# Patient Record
Sex: Male | Born: 1975 | Hispanic: No | Marital: Married | Smoking: Former smoker
Health system: Southern US, Community
[De-identification: ages and names within clinical notes are randomized; demographics above are authoritative.]

## PROBLEM LIST (undated history)

## (undated) DIAGNOSIS — S0990XA Unspecified injury of head, initial encounter: Secondary | ICD-10-CM

## (undated) DIAGNOSIS — K603 Anal fistula, unspecified: Secondary | ICD-10-CM

## (undated) DIAGNOSIS — G40309 Generalized idiopathic epilepsy and epileptic syndromes, not intractable, without status epilepticus: Secondary | ICD-10-CM

## (undated) DIAGNOSIS — G40209 Localization-related (focal) (partial) symptomatic epilepsy and epileptic syndromes with complex partial seizures, not intractable, without status epilepticus: Secondary | ICD-10-CM

## (undated) DIAGNOSIS — R569 Unspecified convulsions: Secondary | ICD-10-CM

## (undated) HISTORY — DX: Generalized idiopathic epilepsy and epileptic syndromes, not intractable, without status epilepticus: G40.309

## (undated) HISTORY — PX: OTHER SURGICAL HISTORY: SHX169

## (undated) HISTORY — DX: Localization-related (focal) (partial) symptomatic epilepsy and epileptic syndromes with complex partial seizures, not intractable, without status epilepticus: G40.209

---

## 1997-11-08 ENCOUNTER — Encounter: Payer: Self-pay | Admitting: Emergency Medicine

## 1997-11-08 ENCOUNTER — Emergency Department (HOSPITAL_COMMUNITY): Admission: EM | Admit: 1997-11-08 | Discharge: 1997-11-08 | Payer: Self-pay | Admitting: Emergency Medicine

## 1999-07-17 ENCOUNTER — Encounter: Payer: Self-pay | Admitting: Emergency Medicine

## 1999-07-17 ENCOUNTER — Emergency Department (HOSPITAL_COMMUNITY): Admission: EM | Admit: 1999-07-17 | Discharge: 1999-07-17 | Payer: Self-pay | Admitting: Emergency Medicine

## 2000-02-26 ENCOUNTER — Emergency Department (HOSPITAL_COMMUNITY): Admission: EM | Admit: 2000-02-26 | Discharge: 2000-02-27 | Payer: Self-pay | Admitting: Emergency Medicine

## 2000-06-11 ENCOUNTER — Encounter: Payer: Self-pay | Admitting: Emergency Medicine

## 2000-06-11 ENCOUNTER — Inpatient Hospital Stay (HOSPITAL_COMMUNITY): Admission: EM | Admit: 2000-06-11 | Discharge: 2000-06-14 | Payer: Self-pay | Admitting: Emergency Medicine

## 2000-06-12 ENCOUNTER — Encounter: Payer: Self-pay | Admitting: *Deleted

## 2003-03-20 ENCOUNTER — Emergency Department (HOSPITAL_COMMUNITY): Admission: EM | Admit: 2003-03-20 | Discharge: 2003-03-20 | Payer: Self-pay | Admitting: Emergency Medicine

## 2006-01-17 ENCOUNTER — Emergency Department (HOSPITAL_COMMUNITY): Admission: EM | Admit: 2006-01-17 | Discharge: 2006-01-17 | Payer: Self-pay | Admitting: Emergency Medicine

## 2011-01-30 ENCOUNTER — Emergency Department (HOSPITAL_COMMUNITY)
Admission: EM | Admit: 2011-01-30 | Discharge: 2011-01-30 | Disposition: A | Discharge status: Home / Self Care | Payer: Self-pay | Attending: Emergency Medicine | Admitting: Emergency Medicine

## 2011-01-30 DIAGNOSIS — F17200 Nicotine dependence, unspecified, uncomplicated: Secondary | ICD-10-CM | POA: Insufficient documentation

## 2011-01-30 DIAGNOSIS — R569 Unspecified convulsions: Secondary | ICD-10-CM | POA: Insufficient documentation

## 2011-01-30 DIAGNOSIS — L0231 Cutaneous abscess of buttock: Secondary | ICD-10-CM | POA: Insufficient documentation

## 2011-01-30 HISTORY — DX: Unspecified convulsions: R56.9

## 2011-01-30 MED ORDER — OXYCODONE-ACETAMINOPHEN 5-325 MG PO TABS: 2.0000 | ORAL_TABLET | ORAL | Status: AC | PRN

## 2011-01-30 MED ORDER — OXYCODONE-ACETAMINOPHEN 5-325 MG PO TABS
1.0000 | ORAL_TABLET | Freq: Once | ORAL | Status: AC
  Administered 2011-01-30: 1 via ORAL
  Filled 2011-01-30: qty 1

## 2011-01-30 NOTE — ED Notes (Signed)
abcess on rt. buttocks

## 2011-01-30 NOTE — ED Notes (Signed)
I & D performed by Ruby Cola, PA at approx [phone].

## 2011-01-30 NOTE — ED Provider Notes (Signed)
History     CSN: [id number] Arrival date & time: 01/30/2011  8:18 AM   First MD Initiated Contact with Patient 01/30/11 [phone]      Chief Complaint  Patient presents with  . Abscess    (Consider location/radiation/quality/duration/timing/severity/associated sxs/prior treatment) HPI History provided by pt.   Pt has had a boil on right buttock for the past week.  Has severe pain that is aggravated by sitting and having a BM.  Has tried sitting in a hot bath but abscess has not drained.  No associated fever.  No trauma to this area.   Past Medical History  Diagnosis Date  . Seizures     History reviewed. No pertinent past surgical history.  History reviewed. No pertinent family history.  History  Substance Use Topics  . Smoking status: Current Everyday Smoker  . Smokeless tobacco: Not on file  . Alcohol Use: No      Review of Systems  All other systems reviewed and are negative.    Allergies  Review of patient's allergies indicates no known allergies.  Home Medications   Current Outpatient Rx  Name Route Sig Dispense Refill  . CARBAMAZEPINE 200 MG PO TABS Oral Take 300 mg by mouth 2 (two) times daily.        BP 158/89  Pulse 96  Temp 97.9 F (36.6 C)  Resp 18  Ht 6\' 3"  (1.905 m)  Wt 225 lb (102.059 kg)  BMI 28.12 kg/m2  SpO2 98%  Physical Exam  Nursing note and vitals reviewed. Constitutional: He is oriented to person, place, and time. He appears well-developed and well-nourished. No distress.  HENT:  Head: Normocephalic and atraumatic.  Eyes:       Normal appearance  Neck: Normal range of motion.  Genitourinary: Rectum normal.  Neurological: He is alert and oriented to person, place, and time.  Skin:       1.5cm abscess w/ approx 3cm surrounding induration just right of rectum.    Psychiatric: He has a normal mood and affect. His behavior is normal.    ED Course  Procedures (including critical care time)  Labs Reviewed - No data to  display No results found.   1. Abscess of buttock, right       MDM  Pt presents w/ abscess of right buttock.  Rectum nml.  Abscess drained a large amt of pus spontaneously in ED.  Did not I&D.  D/c'd home w/ pain medication.  Advised f/u w/ PCP if pain/edema worsens or he develops difficulty w/ having a BM.          Arie Sabina [place], [place] 01/30/11 [phone]

## 2011-01-31 NOTE — ED Provider Notes (Signed)
Medical screening examination/treatment/procedure(s) were performed by non-physician practitioner and as supervising physician I was immediately available for consultation/collaboration.  Celene Kras, MD 01/31/11 [phone]

## 2012-08-07 ENCOUNTER — Telehealth: Payer: Self-pay | Admitting: *Deleted

## 2012-08-07 NOTE — Telephone Encounter (Signed)
Patient has had 3 sizures in the last three days he is on  CARBAMAZEPINE 200 MG TABS 3 times a day and states he takes it correctly. Patient is a former Dr. Thad Ranger patient who was only  seen by carolyn martin. He will see Dr. Terrace Arabia on [phone]

## 2012-08-07 NOTE — Telephone Encounter (Signed)
Message copied by Salome Spotted on Mon Aug 07, 2012  3:50 PM ------      Message from: [facility], [place]      Created: Mon Aug 07, 2012  3:20 PM      Contact: Christina       Pt has appt for 8/7 but he keeps having seizures and wants to be seen sooner.  Please call pt back and [phone] ------

## 2012-08-08 ENCOUNTER — Ambulatory Visit: Payer: Self-pay | Admitting: Neurology

## 2012-08-31 ENCOUNTER — Encounter: Payer: Self-pay | Admitting: Neurology

## 2012-08-31 DIAGNOSIS — G40309 Generalized idiopathic epilepsy and epileptic syndromes, not intractable, without status epilepticus: Secondary | ICD-10-CM

## 2012-08-31 DIAGNOSIS — G40209 Localization-related (focal) (partial) symptomatic epilepsy and epileptic syndromes with complex partial seizures, not intractable, without status epilepticus: Secondary | ICD-10-CM | POA: Insufficient documentation

## 2012-09-01 ENCOUNTER — Encounter: Payer: Self-pay | Admitting: Neurology

## 2012-09-01 ENCOUNTER — Ambulatory Visit (INDEPENDENT_AMBULATORY_CARE_PROVIDER_SITE_OTHER): Payer: BC Managed Care – PPO | Admitting: Neurology

## 2012-09-01 VITALS — BP 138/88 | HR 78 | Ht 71.5 in | Wt 218.0 lb

## 2012-09-01 DIAGNOSIS — G40209 Localization-related (focal) (partial) symptomatic epilepsy and epileptic syndromes with complex partial seizures, not intractable, without status epilepticus: Secondary | ICD-10-CM

## 2012-09-01 DIAGNOSIS — G40309 Generalized idiopathic epilepsy and epileptic syndromes, not intractable, without status epilepticus: Secondary | ICD-10-CM

## 2012-09-01 MED ORDER — LEVETIRACETAM 750 MG PO TABS: 750.0000 mg | ORAL_TABLET | Freq: Two times a day (BID) | ORAL | Status: DC
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
## 2012-09-05 ENCOUNTER — Telehealth: Payer: Self-pay

## 2012-09-05 NOTE — Telephone Encounter (Signed)
Message copied by Dan Humphreys on Tue Sep 05, 2012  4:32 PM ------      Message from: Eugenie Birks      Created: Tue Aug 08, 2012  8:36 AM       Pt wife called he could not keep appt today but wants to be called if you get something else -[phone]- christina ------

## 2012-09-05 NOTE — Telephone Encounter (Signed)
I have called left messages. No returned calls. Will be glade to schedule follow with Dr.Yan.

## 2012-09-21 ENCOUNTER — Ambulatory Visit: Payer: Self-pay | Admitting: Nurse Practitioner

## 2012-11-17 ENCOUNTER — Ambulatory Visit: Payer: Self-pay | Admitting: Neurology

## 2012-12-21 ENCOUNTER — Ambulatory Visit: Payer: BC Managed Care – PPO | Admitting: Neurology

## 2013-01-02 ENCOUNTER — Ambulatory Visit: Payer: BC Managed Care – PPO | Admitting: Neurology

## 2013-03-27 ENCOUNTER — Encounter: Payer: Self-pay | Admitting: Neurology

## 2013-03-27 ENCOUNTER — Ambulatory Visit (INDEPENDENT_AMBULATORY_CARE_PROVIDER_SITE_OTHER): Payer: BC Managed Care – PPO | Admitting: Neurology

## 2013-03-27 ENCOUNTER — Encounter (INDEPENDENT_AMBULATORY_CARE_PROVIDER_SITE_OTHER): Payer: Self-pay

## 2013-03-27 VITALS — BP 117/72 | HR 65 | Ht 71.0 in | Wt 213.0 lb

## 2013-03-27 DIAGNOSIS — G40309 Generalized idiopathic epilepsy and epileptic syndromes, not intractable, without status epilepticus: Secondary | ICD-10-CM

## 2013-03-27 DIAGNOSIS — G40209 Localization-related (focal) (partial) symptomatic epilepsy and epileptic syndromes with complex partial seizures, not intractable, without status epilepticus: Secondary | ICD-10-CM

## 2013-03-27 MED ORDER — LEVETIRACETAM 750 MG PO TABS: 750.0000 mg | ORAL_TABLET | Freq: Two times a day (BID) | ORAL | Status: DC

## 2013-03-27 MED ORDER — ZOLPIDEM TARTRATE 5 MG PO TABS: 5.0000 mg | ORAL_TABLET | Freq: Every evening | ORAL | Status: DC | PRN
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
## 2013-09-06 ENCOUNTER — Other Ambulatory Visit: Payer: Self-pay | Admitting: Neurology

## 2014-03-27 ENCOUNTER — Encounter: Payer: Self-pay | Admitting: Neurology

## 2014-03-27 ENCOUNTER — Ambulatory Visit (INDEPENDENT_AMBULATORY_CARE_PROVIDER_SITE_OTHER): Payer: BLUE CROSS/BLUE SHIELD | Admitting: Neurology

## 2014-03-27 VITALS — BP 128/82 | HR 72 | Ht 71.0 in | Wt 212.0 lb

## 2014-03-27 DIAGNOSIS — G40209 Localization-related (focal) (partial) symptomatic epilepsy and epileptic syndromes with complex partial seizures, not intractable, without status epilepticus: Secondary | ICD-10-CM

## 2014-03-27 MED ORDER — LEVETIRACETAM ER 750 MG PO TB24: 1500.0000 mg | ORAL_TABLET | Freq: Every day | ORAL | Status: DC
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
## 2014-04-08 ENCOUNTER — Ambulatory Visit (INDEPENDENT_AMBULATORY_CARE_PROVIDER_SITE_OTHER): Payer: BLUE CROSS/BLUE SHIELD | Admitting: Neurology

## 2014-04-08 DIAGNOSIS — G40209 Localization-related (focal) (partial) symptomatic epilepsy and epileptic syndromes with complex partial seizures, not intractable, without status epilepticus: Secondary | ICD-10-CM
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
## 2014-04-18 ENCOUNTER — Other Ambulatory Visit: Payer: Self-pay | Admitting: Neurology

## 2014-06-25 ENCOUNTER — Encounter: Payer: Self-pay | Admitting: Neurology

## 2014-06-25 ENCOUNTER — Ambulatory Visit (INDEPENDENT_AMBULATORY_CARE_PROVIDER_SITE_OTHER): Payer: BLUE CROSS/BLUE SHIELD | Admitting: Neurology

## 2014-06-25 VITALS — BP 135/90 | HR 58 | Ht 71.0 in | Wt 204.0 lb

## 2014-06-25 DIAGNOSIS — G40209 Localization-related (focal) (partial) symptomatic epilepsy and epileptic syndromes with complex partial seizures, not intractable, without status epilepticus: Secondary | ICD-10-CM | POA: Diagnosis not present

## 2014-06-25 MED ORDER — LEVETIRACETAM ER 750 MG PO TB24: 1500.0000 mg | ORAL_TABLET | Freq: Every day | ORAL | Status: DC
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
## 2015-03-24 ENCOUNTER — Other Ambulatory Visit: Payer: Self-pay | Admitting: Neurology

## 2015-04-22 ENCOUNTER — Telehealth: Payer: Self-pay | Admitting: Neurology

## 2015-04-22 NOTE — Telephone Encounter (Signed)
Wife called regarding Levetiracetam 750 MG TB24,wants to see if there is a cheaper medication to switch patient to. Patient no longer has insurance.

## 2015-04-23 ENCOUNTER — Encounter: Payer: Self-pay | Admitting: *Deleted

## 2015-04-23 MED ORDER — DIVALPROEX SODIUM 500 MG PO DR TAB: DELAYED_RELEASE_TABLET | ORAL | Status: DC

## 2015-04-23 NOTE — Telephone Encounter (Signed)
Pt agreeable to change and will start new medication.  Per Dr. Zannie CoveYan's instructions, for one week he will take Keppra XR 750mg , one tab in am and Depakote 1000mg  in pm.  After one week, he will stop Keppra XR and take Depakote 500mg  in am and 1000mg  in pm (verbalized understanding).  He will call back with any concerns.

## 2015-04-23 NOTE — Telephone Encounter (Signed)
Per vo by Dr. Terrace ArabiaYan, send in rx for divalproex 500mg , one tab in the morning and two tablets in the evening (#90 x 11).  [facility] [place] in order for him to use a Good Rx coupon.  His cost for 90 tablets will be $43.62.

## 2015-06-25 ENCOUNTER — Ambulatory Visit: Payer: BLUE CROSS/BLUE SHIELD | Admitting: Nurse Practitioner

## 2015-07-01 ENCOUNTER — Ambulatory Visit: Payer: BLUE CROSS/BLUE SHIELD | Admitting: Nurse Practitioner

## 2015-07-02 ENCOUNTER — Encounter: Payer: Self-pay | Admitting: Nurse Practitioner

## 2016-03-02 ENCOUNTER — Telehealth: Payer: Self-pay | Admitting: Neurology

## 2016-03-02 ENCOUNTER — Other Ambulatory Visit: Payer: Self-pay | Admitting: *Deleted

## 2016-03-02 MED ORDER — DIVALPROEX SODIUM 500 MG PO DR TAB: DELAYED_RELEASE_TABLET | ORAL | 1 refills | Status: DC

## 2016-03-02 NOTE — Telephone Encounter (Signed)
30 day supply with 1 refill sent to pharmacy 

## 2016-03-02 NOTE — Telephone Encounter (Signed)
Patient called requesting refill for divalproex (DEPAKOTE) 500 MG DR tablet.  Next fu visit 04/26/16 with Darrol Angelarolyn Martin

## 2016-03-21 ENCOUNTER — Other Ambulatory Visit: Payer: Self-pay | Admitting: Neurology

## 2016-03-27 ENCOUNTER — Other Ambulatory Visit: Payer: Self-pay | Admitting: Neurology

## 2016-03-30 ENCOUNTER — Other Ambulatory Visit: Payer: Self-pay | Admitting: Neurology

## 2016-03-30 ENCOUNTER — Other Ambulatory Visit: Payer: Self-pay | Admitting: *Deleted

## 2016-03-30 MED ORDER — DIVALPROEX SODIUM 500 MG PO DR TAB: DELAYED_RELEASE_TABLET | ORAL | 0 refills | Status: DC

## 2016-03-31 ENCOUNTER — Other Ambulatory Visit: Payer: Self-pay | Admitting: *Deleted

## 2016-03-31 ENCOUNTER — Telehealth: Payer: Self-pay | Admitting: Neurology

## 2016-03-31 MED ORDER — DIVALPROEX SODIUM 500 MG PO DR TAB: DELAYED_RELEASE_TABLET | ORAL | 0 refills | Status: DC

## 2016-03-31 NOTE — Telephone Encounter (Signed)
Returned call - no answer - left message letting him know his pharmacy has been updated and the prescription has been sent in for him.

## 2016-03-31 NOTE — Telephone Encounter (Addendum)
Pt called to check on RX for divalproex (DEPAKOTE) 500 MG DR tablet , said he was out of medication. I [facility] [facility] Says he has never used [facility]. [facility] [facility]. Please call the patient to let him know it has been sent. Thank you

## 2016-04-26 ENCOUNTER — Ambulatory Visit (INDEPENDENT_AMBULATORY_CARE_PROVIDER_SITE_OTHER): Payer: BLUE CROSS/BLUE SHIELD | Admitting: Nurse Practitioner

## 2016-04-26 ENCOUNTER — Telehealth: Payer: Self-pay | Admitting: Nurse Practitioner

## 2016-04-26 ENCOUNTER — Encounter: Payer: Self-pay | Admitting: Nurse Practitioner

## 2016-04-26 ENCOUNTER — Encounter (INDEPENDENT_AMBULATORY_CARE_PROVIDER_SITE_OTHER): Payer: Self-pay

## 2016-04-26 VITALS — BP 126/79 | HR 76 | Ht 74.0 in | Wt 230.6 lb

## 2016-04-26 DIAGNOSIS — G40209 Localization-related (focal) (partial) symptomatic epilepsy and epileptic syndromes with complex partial seizures, not intractable, without status epilepticus: Secondary | ICD-10-CM

## 2016-04-26 DIAGNOSIS — G40309 Generalized idiopathic epilepsy and epileptic syndromes, not intractable, without status epilepticus: Secondary | ICD-10-CM | POA: Diagnosis not present

## 2016-04-26 DIAGNOSIS — Z1211 Encounter for screening for malignant neoplasm of colon: Secondary | ICD-10-CM | POA: Insufficient documentation

## 2016-04-26 DIAGNOSIS — Z5181 Encounter for therapeutic drug level monitoring: Secondary | ICD-10-CM | POA: Diagnosis not present

## 2016-04-26 MED ORDER — DIVALPROEX SODIUM ER 500 MG PO TB24: ORAL_TABLET | ORAL | 6 refills | Status: DC

## 2016-04-26 NOTE — Addendum Note (Signed)
Addended by: Donnelly AngelicaHOGAN, Ryliegh Mcduffey L on: 04/26/2016 09:34 AM   Modules accepted: Orders

## 2016-04-26 NOTE — Telephone Encounter (Signed)
Patients medication for today needs to go to [facility] please. [address]
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
## 2016-04-26 NOTE — Progress Notes (Signed)
I have reviewed and agreed above plan. 

## 2016-04-26 NOTE — Patient Instructions (Signed)
Will check labs today along with Depakote level Will change Depakote to extended release formulation Call for any further seizure activity Follow-up in 6 months

## 2016-04-26 NOTE — Telephone Encounter (Signed)
Rx resent to requested pharmacy.

## 2016-04-27 ENCOUNTER — Telehealth: Payer: Self-pay | Admitting: *Deleted

## 2016-04-27 LAB — CBC WITH DIFFERENTIAL/PLATELET
Basophils Absolute: 0.1 10*3/uL (ref 0.0–0.2)
Basos: 1 %
EOS (ABSOLUTE): 0.7 10*3/uL — ABNORMAL HIGH (ref 0.0–0.4)
Eos: 6 %
Hematocrit: 45.4 % (ref 37.5–51.0)
Hemoglobin: 15.3 g/dL (ref 13.0–17.7)
Immature Grans (Abs): 0 10*3/uL (ref 0.0–0.1)
Immature Granulocytes: 0 %
Lymphocytes Absolute: 3.4 10*3/uL — ABNORMAL HIGH (ref 0.7–3.1)
Lymphs: 27 %
MCH: 32.3 pg (ref 26.6–33.0)
MCHC: 33.7 g/dL (ref 31.5–35.7)
MCV: 96 fL (ref 79–97)
Monocytes Absolute: 0.9 10*3/uL (ref 0.1–0.9)
Monocytes: 7 %
Neutrophils Absolute: 7.2 10*3/uL — ABNORMAL HIGH (ref 1.4–7.0)
Neutrophils: 59 %
Platelets: 310 10*3/uL (ref 150–379)
RBC: 4.74 x10E6/uL (ref 4.14–5.80)
RDW: 14 % (ref 12.3–15.4)
WBC: 12.2 10*3/uL — ABNORMAL HIGH (ref 3.4–10.8)

## 2016-04-27 LAB — COMPREHENSIVE METABOLIC PANEL
ALT: 14 IU/L (ref 0–44)
AST: 16 IU/L (ref 0–40)
Albumin/Globulin Ratio: 1.8 (ref 1.2–2.2)
Albumin: 4.3 g/dL (ref 3.5–5.5)
Alkaline Phosphatase: 57 IU/L (ref 39–117)
BUN/Creatinine Ratio: 13 (ref 9–20)
BUN: 12 mg/dL (ref 6–24)
Bilirubin Total: <0.2 mg/dL (ref 0.0–1.2)
CO2: 21 mmol/L (ref 18–29)
Calcium: 9.4 mg/dL (ref 8.7–10.2)
Chloride: 104 mmol/L (ref 96–106)
Creatinine, Ser: 0.92 mg/dL (ref 0.76–1.27)
GFR calc Af Amer: 118 mL/min/{1.73_m2} (ref >59)
GFR calc non Af Amer: 102 mL/min/{1.73_m2} (ref >59)
Globulin, Total: 2.4 g/dL (ref 1.5–4.5)
Glucose: 69 mg/dL (ref 65–99)
Potassium: 4.7 mmol/L (ref 3.5–5.2)
Sodium: 145 mmol/L — ABNORMAL HIGH (ref 134–144)
Total Protein: 6.7 g/dL (ref 6.0–8.5)

## 2016-04-27 LAB — VALPROIC ACID LEVEL: Valproic Acid Lvl: 69 ug/mL (ref 50–100)

## 2016-04-27 NOTE — Telephone Encounter (Signed)
Called and LVM letting pt know labs ok per CW,NP note. Gave [name] phone number if he has further questions or concerns.

## 2016-04-27 NOTE — Telephone Encounter (Signed)
-----   Message from Nilda RiggsNancy Carolyn Martin, NP sent at 04/27/2016  8:48 AM EDT ----- Labs ok please call

## 2016-11-01 ENCOUNTER — Ambulatory Visit: Payer: BLUE CROSS/BLUE SHIELD | Admitting: Nurse Practitioner

## 2016-11-21 ENCOUNTER — Other Ambulatory Visit: Payer: Self-pay | Admitting: Nurse Practitioner

## 2016-12-22 ENCOUNTER — Other Ambulatory Visit: Payer: Self-pay | Admitting: Nurse Practitioner

## 2016-12-27 ENCOUNTER — Telehealth: Payer: Self-pay | Admitting: Neurology

## 2016-12-27 NOTE — Telephone Encounter (Addendum)
error 

## 2017-04-13 ENCOUNTER — Encounter: Payer: Self-pay | Admitting: Neurology

## 2017-04-13 ENCOUNTER — Encounter (INDEPENDENT_AMBULATORY_CARE_PROVIDER_SITE_OTHER): Payer: Self-pay

## 2017-04-13 ENCOUNTER — Ambulatory Visit (INDEPENDENT_AMBULATORY_CARE_PROVIDER_SITE_OTHER): Payer: BLUE CROSS/BLUE SHIELD | Admitting: Neurology

## 2017-04-13 VITALS — BP 139/93 | HR 73 | Ht 74.0 in | Wt 224.0 lb

## 2017-04-13 DIAGNOSIS — G40209 Localization-related (focal) (partial) symptomatic epilepsy and epileptic syndromes with complex partial seizures, not intractable, without status epilepticus: Secondary | ICD-10-CM

## 2017-04-13 MED ORDER — DIVALPROEX SODIUM ER 500 MG PO TB24: 1500.0000 mg | ORAL_TABLET | Freq: Every day | ORAL | 6 refills | Status: DC
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
## 2018-04-17 ENCOUNTER — Encounter: Payer: Self-pay | Admitting: Neurology

## 2018-04-17 ENCOUNTER — Ambulatory Visit: Payer: BLUE CROSS/BLUE SHIELD | Admitting: Neurology

## 2018-04-17 VITALS — BP 138/87 | HR 62 | Ht 74.0 in | Wt 225.0 lb

## 2018-04-17 DIAGNOSIS — G40309 Generalized idiopathic epilepsy and epileptic syndromes, not intractable, without status epilepticus: Secondary | ICD-10-CM

## 2018-04-17 MED ORDER — DIVALPROEX SODIUM ER 500 MG PO TB24: 1500.0000 mg | ORAL_TABLET | Freq: Every day | ORAL | 4 refills | Status: DC
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
## 2018-04-18 ENCOUNTER — Telehealth: Payer: Self-pay | Admitting: Neurology

## 2018-04-18 LAB — CBC WITH DIFFERENTIAL
Basophils Absolute: 0.1 10*3/uL (ref 0.0–0.2)
Basos: 0 %
EOS (ABSOLUTE): 0.6 10*3/uL — ABNORMAL HIGH (ref 0.0–0.4)
Eos: 5 %
Hematocrit: 42.2 % (ref 37.5–51.0)
Hemoglobin: 14.6 g/dL (ref 13.0–17.7)
Immature Grans (Abs): 0 10*3/uL (ref 0.0–0.1)
Immature Granulocytes: 0 %
Lymphocytes Absolute: 2 10*3/uL (ref 0.7–3.1)
Lymphs: 17 %
MCH: 33.2 pg — ABNORMAL HIGH (ref 26.6–33.0)
MCHC: 34.6 g/dL (ref 31.5–35.7)
MCV: 96 fL (ref 79–97)
Monocytes Absolute: 0.6 10*3/uL (ref 0.1–0.9)
Monocytes: 6 %
Neutrophils Absolute: 8.2 10*3/uL — ABNORMAL HIGH (ref 1.4–7.0)
Neutrophils: 72 %
RBC: 4.4 x10E6/uL (ref 4.14–5.80)
RDW: 12.7 % (ref 11.6–15.4)
WBC: 11.5 10*3/uL — ABNORMAL HIGH (ref 3.4–10.8)

## 2018-04-18 LAB — COMPREHENSIVE METABOLIC PANEL
ALT: 12 IU/L (ref 0–44)
AST: 13 IU/L (ref 0–40)
Albumin/Globulin Ratio: 1.9 (ref 1.2–2.2)
Albumin: 4.4 g/dL (ref 4.0–5.0)
Alkaline Phosphatase: 63 IU/L (ref 39–117)
BUN/Creatinine Ratio: 8 — ABNORMAL LOW (ref 9–20)
BUN: 8 mg/dL (ref 6–24)
Bilirubin Total: <0.2 mg/dL (ref 0.0–1.2)
CO2: 24 mmol/L (ref 20–29)
Calcium: 9.9 mg/dL (ref 8.7–10.2)
Chloride: 103 mmol/L (ref 96–106)
Creatinine, Ser: 1 mg/dL (ref 0.76–1.27)
GFR calc Af Amer: 105 mL/min/{1.73_m2} (ref >59)
GFR calc non Af Amer: 91 mL/min/{1.73_m2} (ref >59)
Globulin, Total: 2.3 g/dL (ref 1.5–4.5)
Glucose: 93 mg/dL (ref 65–99)
Potassium: 4.8 mmol/L (ref 3.5–5.2)
Sodium: 142 mmol/L (ref 134–144)
Total Protein: 6.7 g/dL (ref 6.0–8.5)

## 2018-04-18 LAB — LIPID PANEL
Chol/HDL Ratio: 4.6 ratio (ref 0.0–5.0)
Cholesterol, Total: 166 mg/dL (ref 100–199)
HDL: 36 mg/dL — ABNORMAL LOW (ref >39)
LDL Calculated: 108 mg/dL — ABNORMAL HIGH (ref 0–99)
Triglycerides: 108 mg/dL (ref 0–149)
VLDL Cholesterol Cal: 22 mg/dL (ref 5–40)

## 2018-04-18 LAB — VALPROIC ACID LEVEL: Valproic Acid Lvl: 44 ug/mL — ABNORMAL LOW (ref 50–100)

## 2018-04-18 NOTE — Telephone Encounter (Signed)
Please call patient, laboratory evaluation showed normal CMP, CBC showed mild elevated WBC, is at his baseline, fasting lipid profile showed mildly elevated LDL 108, he should start with exercise, diet control, Depakote level was 44,  He should continue current Depakote ER 500 mg 3 tablets every night

## 2018-04-18 NOTE — Telephone Encounter (Signed)
Spoke to patient and he is aware of his lab results.  He understands Dr. Zannie Cove recommendations for exercise and diet control.  He will also continue his current Depakote ER 500mg , 3 tablets every night.

## 2018-04-27 ENCOUNTER — Other Ambulatory Visit: Payer: Self-pay | Admitting: Neurology

## 2018-04-30 ENCOUNTER — Other Ambulatory Visit: Payer: Self-pay | Admitting: Neurology

## 2018-05-01 ENCOUNTER — Other Ambulatory Visit: Payer: Self-pay | Admitting: *Deleted

## 2018-05-01 MED ORDER — DIVALPROEX SODIUM ER 500 MG PO TB24: 1500.0000 mg | ORAL_TABLET | Freq: Every day | ORAL | 3 refills | Status: DC

## 2019-01-29 ENCOUNTER — Telehealth: Payer: Self-pay | Admitting: Neurology

## 2019-01-29 MED ORDER — DIVALPROEX SODIUM ER 500 MG PO TB24: 1500.0000 mg | ORAL_TABLET | Freq: Every day | ORAL | 1 refills | Status: DC

## 2019-01-29 NOTE — Telephone Encounter (Signed)
The patient called back and requested [facility] on [address] in [place].  He did not mean to tell us [facility].  Rx canceled at [facility].  Pharmacy updated and prescription resent.

## 2019-01-29 NOTE — Telephone Encounter (Signed)
Pt called stating he is needing refill on his divalproex (DEPAKOTE ER) 500 MG 24 hr tablet sent to the [facility] on [address]. [phone]

## 2019-01-29 NOTE — Telephone Encounter (Signed)
Refills sent to requested pharmacy and updated in his chart.

## 2019-04-18 ENCOUNTER — Encounter: Payer: Self-pay | Admitting: Neurology

## 2019-04-18 ENCOUNTER — Ambulatory Visit: Payer: BLUE CROSS/BLUE SHIELD | Admitting: Neurology
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
## 2019-05-12 ENCOUNTER — Other Ambulatory Visit: Payer: Self-pay | Admitting: Neurology

## 2019-05-24 ENCOUNTER — Telehealth: Payer: Self-pay | Admitting: Neurology

## 2019-05-24 NOTE — Telephone Encounter (Signed)
The prescription was sent on 05/13/19. The patient was told by [facility] that it was not there. I called and spoke to Candace who confirmed the rx was placed on his profile. They will get is ready for pick up. The patient is aware of this update.

## 2019-05-24 NOTE — Telephone Encounter (Signed)
Pt has called for a refill on his divalproex (DEPAKOTE ER) 500 MG 24 hr tablet [id number]

## 2019-06-19 ENCOUNTER — Encounter: Payer: Self-pay | Admitting: Neurology

## 2019-06-19 ENCOUNTER — Ambulatory Visit (INDEPENDENT_AMBULATORY_CARE_PROVIDER_SITE_OTHER): Payer: 59 | Admitting: Neurology

## 2019-06-19 ENCOUNTER — Other Ambulatory Visit: Payer: Self-pay

## 2019-06-19 VITALS — BP 132/89 | HR 74 | Temp 97.4°F | Ht 74.0 in | Wt 218.0 lb

## 2019-06-19 DIAGNOSIS — G40209 Localization-related (focal) (partial) symptomatic epilepsy and epileptic syndromes with complex partial seizures, not intractable, without status epilepticus: Secondary | ICD-10-CM | POA: Diagnosis not present

## 2019-06-19 MED ORDER — DIVALPROEX SODIUM ER 500 MG PO TB24: 1500.0000 mg | ORAL_TABLET | Freq: Every day | ORAL | 4 refills | Status: DC
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
## 2019-06-20 ENCOUNTER — Telehealth: Payer: Self-pay | Admitting: *Deleted

## 2019-06-20 LAB — CBC WITH DIFFERENTIAL
Basophils Absolute: 0.1 10*3/uL (ref 0.0–0.2)
Basos: 1 %
EOS (ABSOLUTE): 0.5 10*3/uL — ABNORMAL HIGH (ref 0.0–0.4)
Eos: 5 %
Hematocrit: 46.1 % (ref 37.5–51.0)
Hemoglobin: 15.2 g/dL (ref 13.0–17.7)
Immature Grans (Abs): 0 10*3/uL (ref 0.0–0.1)
Immature Granulocytes: 0 %
Lymphocytes Absolute: 2.4 10*3/uL (ref 0.7–3.1)
Lymphs: 27 %
MCH: 32.1 pg (ref 26.6–33.0)
MCHC: 33 g/dL (ref 31.5–35.7)
MCV: 97 fL (ref 79–97)
Monocytes Absolute: 0.7 10*3/uL (ref 0.1–0.9)
Monocytes: 8 %
Neutrophils Absolute: 5.2 10*3/uL (ref 1.4–7.0)
Neutrophils: 59 %
RBC: 4.74 x10E6/uL (ref 4.14–5.80)
RDW: 13.1 % (ref 11.6–15.4)
WBC: 8.8 10*3/uL (ref 3.4–10.8)

## 2019-06-20 LAB — COMPREHENSIVE METABOLIC PANEL
ALT: 17 IU/L (ref 0–44)
AST: 13 IU/L (ref 0–40)
Albumin/Globulin Ratio: 1.9 (ref 1.2–2.2)
Albumin: 4.3 g/dL (ref 4.0–5.0)
Alkaline Phosphatase: 72 IU/L (ref 39–117)
BUN/Creatinine Ratio: 12 (ref 9–20)
BUN: 11 mg/dL (ref 6–24)
Bilirubin Total: <0.2 mg/dL (ref 0.0–1.2)
CO2: 24 mmol/L (ref 20–29)
Calcium: 9.8 mg/dL (ref 8.7–10.2)
Chloride: 106 mmol/L (ref 96–106)
Creatinine, Ser: 0.95 mg/dL (ref 0.76–1.27)
GFR calc Af Amer: 111 mL/min/{1.73_m2} (ref >59)
GFR calc non Af Amer: 96 mL/min/{1.73_m2} (ref >59)
Globulin, Total: 2.3 g/dL (ref 1.5–4.5)
Glucose: 96 mg/dL (ref 65–99)
Potassium: 4.6 mmol/L (ref 3.5–5.2)
Sodium: 144 mmol/L (ref 134–144)
Total Protein: 6.6 g/dL (ref 6.0–8.5)

## 2019-06-20 LAB — TSH: TSH: 1.82 u[IU]/mL (ref 0.450–4.500)

## 2019-06-20 NOTE — Telephone Encounter (Signed)
I spoke to the patient and he is aware of his lab results. 

## 2019-06-20 NOTE — Telephone Encounter (Signed)
-----   Message from Levert Feinstein, MD sent at 06/20/2019 10:34 AM EDT ----- Please call patient for normal laboratory result

## 2019-07-09 ENCOUNTER — Ambulatory Visit: Payer: Self-pay | Admitting: General Surgery

## 2019-07-09 NOTE — H&P (Signed)
  The patient is a 44 year old male who presents with a complaint of Mass. 44 year old male who presents to the office with a mass noticed on his buttock. Over the past few months. He reports that it opens up and drained purulence and then closes. Then this reoccurs. He has never had anything like this before.   Diagnostic Studies History Renee Ramus, CMA; 07/09/2019 1:26 PM) Colonoscopy 5-10 years ago  Allergies Renee Ramus, CMA; 07/09/2019 1:27 PM) No Known Drug Allergies [07/09/2019]:  Medication History (Armen Ferguson, CMA; 07/09/2019 1:27 PM) Divalproex Sodium ER (500MG  Tablet ER 24HR, Oral) Active. Medications Reconciled  Social History , CMA; 07/09/2019 1:26 PM) Alcohol use Occasional alcohol use. Caffeine use Tea. No drug use Tobacco use Former smoker.  Family History 07/11/2019, CMA; 07/09/2019 1:26 PM) Breast Cancer Mother. Diabetes Mellitus Father, Mother. Prostate Cancer Father.  Other Problems 07/11/2019, CMA; 07/09/2019 1:26 PM) Seizure Disorder     Review of Systems (Armen Ferguson CMA; 07/09/2019 1:26 PM) General Not Present- Appetite Loss, Chills, Fatigue, Fever, Night Sweats, Weight Gain and Weight Loss. Breast Not Present- Breast Mass, Breast Pain, Nipple Discharge and Skin Changes. Gastrointestinal Not Present- Abdominal Pain, Bloating, Bloody Stool, Change in Bowel Habits, Chronic diarrhea, Constipation, Difficulty Swallowing, Excessive gas, Gets full quickly at meals, Hemorrhoids, Indigestion, Nausea, Rectal Pain and Vomiting. Male Genitourinary Not Present- Blood in Urine, Change in Urinary Stream, Frequency, Impotence, Nocturia, Painful Urination, Urgency and Urine Leakage. Psychiatric Not Present- Anxiety, Bipolar, Change in Sleep Pattern, Depression, Fearful and Frequent crying. Endocrine Not Present- Cold Intolerance, Excessive Hunger, Hair Changes, Heat Intolerance, Hot flashes and New Diabetes. Hematology Not  Present- Blood Thinners, Easy Bruising, Excessive bleeding, Gland problems, HIV and Persistent Infections.  Vitals (Armen Ferguson CMA; 07/09/2019 1:27 PM) 07/09/2019 1:27 PM Weight: 213.5 lb Height: 73in Body Surface Area: 2.21 m Body Mass Index: 28.17 kg/m  Temp.: 98.80F  Pulse: 91 (Regular)  P.OX: 95% (Room air) BP: 132/90(Sitting, Left Arm, Standard)        Physical Exam 07/11/2019 MD; 07/09/2019 1:34 PM)  General Mental Status-Alert. General Appearance-Cooperative.  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 I recommended an exam under anesthesia with possible fistulotomy versus seton placement. We discussed fistulotomy as the best way to get rid of these, but this can result in incontinence of more than 20% of the anal sphincter was divided. If this is the case, we will proceed with seton placement and allow this to remain for approximately 3 months. We would then proceed with a ligation of internal fistula tract, which has approximately 80% success rate. We discussed the typical postoperative healing times above procedures. Risk of pain and bleeding were also discussed. All questions were answered.

## 2019-08-22 ENCOUNTER — Other Ambulatory Visit: Payer: Self-pay

## 2019-08-22 ENCOUNTER — Encounter (HOSPITAL_BASED_OUTPATIENT_CLINIC_OR_DEPARTMENT_OTHER): Payer: Self-pay | Admitting: General Surgery

## 2019-08-22 NOTE — Progress Notes (Addendum)
Spoke w/ via phone for pre-op interview---pt Lab needs dos----    I stat 8           COVID test ------08-27-2019 Arrive at -------630 am 08-29-19 No food after midnight clear liquids from midnight until 530 am Medications to take morning of surgery -----none Diabetic medication -----n/a Patient Special Instructions -----none Pre-Op special Istructions -----none Patient verbalized understanding of instructions that were given at this phone interview. Patient denies shortness of breath, chest pain, fever, cough a this phone interview.  Anesthesia : seizures last seizure 3 weeks ago, on medication for seizures patient denies cardiac S & S and sob at this time  Chart to [place] zanetto pa for review  PCP:dr robert scott Neurology dr Merrily Brittle 06-19-2019 epic Cardiologist :none Chest x-ray :none EKG :none Echo :none Cardiac Cath : none Activity level: can climb flight of steps without problems, works full time does house and yard work Sleep Study/ CPAP :none Fasting Blood Sugar :      / Checks Blood Sugar -- times a day:  none Blood Thinner/ Instructions /Last Dose:none ASA / Instructions/ Last Dose : none

## 2019-08-22 NOTE — Progress Notes (Signed)
NEW Covid Policy July 2021  Surgery Day:  08-29-19 Facility:  [facility]  Type of Surgery:anal exan under anesthesia fistulotomy vs seton  Fully Covid Vaccinated:  no Not Covid Vaccinated:  covid test 08-27-19 1000 am  Do you have symptoms?no  In the past 14 days:        Have you had any symptoms?no       Have you been tested covid positive?no       Have you been in contact with someone covid positive?no        Is pt Immuno-compromised?no

## 2019-08-25 ENCOUNTER — Other Ambulatory Visit (HOSPITAL_COMMUNITY): Payer: Self-pay

## 2019-08-27 ENCOUNTER — Other Ambulatory Visit (HOSPITAL_COMMUNITY)
Admission: RE | Admit: 2019-08-27 | Discharge: 2019-08-27 | Disposition: A | Discharge status: Home / Self Care | Payer: 59 | Source: Ambulatory Visit | Attending: General Surgery | Admitting: General Surgery

## 2019-08-27 DIAGNOSIS — Z20822 Contact with and (suspected) exposure to covid-19: Secondary | ICD-10-CM | POA: Diagnosis present

## 2019-08-27 LAB — SARS CORONAVIRUS 2 (TAT 6-24 HRS): SARS Coronavirus 2: NEGATIVE

## 2019-08-27 NOTE — Progress Notes (Signed)
Left pt message to call back to [phone] about last seizure and did he let neurology know/

## 2019-08-28 ENCOUNTER — Encounter (HOSPITAL_BASED_OUTPATIENT_CLINIC_OR_DEPARTMENT_OTHER): Payer: Self-pay | Admitting: General Surgery

## 2019-08-28 NOTE — Anesthesia Preprocedure Evaluation (Addendum)
Anesthesia Evaluation  Patient identified by MRN, date of birth, ID band Patient awake    Reviewed: Allergy & Precautions, H&P , NPO status , Patient's Chart, lab work & pertinent test results  Airway Mallampati: I  TM Distance: >3 FB Neck ROM: Full    Dental no notable dental hx. (+) Teeth Intact, Dental Advisory Given   Pulmonary former smoker,    Pulmonary exam normal breath sounds clear to auscultation       Cardiovascular Exercise Tolerance: Good Normal cardiovascular exam Rhythm:Regular Rate:Normal     Neuro/Psych Seizures -, Well Controlled,     GI/Hepatic   Endo/Other    Renal/GU      Musculoskeletal   Abdominal   Peds  Hematology   Anesthesia Other Findings   Reproductive/Obstetrics                            Anesthesia Physical Anesthesia Plan  ASA: II  Anesthesia Plan: MAC   Post-op Pain Management:    Induction:   PONV Risk Score and Plan:   Airway Management Planned: Nasal Cannula, Simple Face Mask and Mask  Additional Equipment:   Intra-op Plan:   Post-operative Plan:   Informed Consent: I have reviewed the patients History and Physical, chart, labs and discussed the procedure including the risks, benefits and alternatives for the proposed anesthesia with the patient or authorized representative who has indicated his/her understanding and acceptance.       Plan Discussed with: Anesthesiologist and CRNA  Anesthesia Plan Comments:         Anesthesia Quick Evaluation

## 2019-08-28 NOTE — Progress Notes (Addendum)
Spoke with pt by phone, pt stated last seizure since 2018 was 3 weeks aho witnessed by girlfriend in his sleep, it lasted about 5 second, he made weird moaning noise and rolled over hard, pt did not notify neurololgy, pt stated he just usually tells neurololgy at his appointments.Jessica zanetto pa made aware of above note. Spoke with Shanda Bumps zanetto pa pt meets [facility] guidelines

## 2019-08-28 NOTE — Progress Notes (Signed)
Left pt message to call back to [phone] about last seizure and did he let seizure know?

## 2019-08-29 ENCOUNTER — Ambulatory Visit (HOSPITAL_BASED_OUTPATIENT_CLINIC_OR_DEPARTMENT_OTHER)
Admission: RE | Admit: 2019-08-29 | Discharge: 2019-08-29 | Disposition: A | Discharge status: Home / Self Care | Payer: 59 | Attending: General Surgery | Admitting: General Surgery

## 2019-08-29 ENCOUNTER — Encounter (HOSPITAL_BASED_OUTPATIENT_CLINIC_OR_DEPARTMENT_OTHER): Payer: Self-pay | Admitting: General Surgery

## 2019-08-29 ENCOUNTER — Ambulatory Visit (HOSPITAL_BASED_OUTPATIENT_CLINIC_OR_DEPARTMENT_OTHER): Payer: 59 | Admitting: Physician Assistant

## 2019-08-29 ENCOUNTER — Encounter (HOSPITAL_BASED_OUTPATIENT_CLINIC_OR_DEPARTMENT_OTHER)
Admission: RE | Disposition: A | Discharge status: Home / Self Care | Payer: Self-pay | Source: Home / Self Care | Attending: General Surgery

## 2019-08-29 DIAGNOSIS — G40909 Epilepsy, unspecified, not intractable, without status epilepticus: Secondary | ICD-10-CM | POA: Insufficient documentation

## 2019-08-29 DIAGNOSIS — Z87891 Personal history of nicotine dependence: Secondary | ICD-10-CM | POA: Insufficient documentation

## 2019-08-29 DIAGNOSIS — K603 Anal fistula: Secondary | ICD-10-CM | POA: Insufficient documentation

## 2019-08-29 HISTORY — PX: RECTAL EXAM UNDER ANESTHESIA: SHX6399

## 2019-08-29 HISTORY — PX: FISTULOTOMY: SHX6413

## 2019-08-29 HISTORY — DX: Unspecified injury of head, initial encounter: S09.90XA

## 2019-08-29 LAB — POCT I-STAT, CHEM 8
BUN: 9 mg/dL (ref 6–20)
Calcium, Ion: 1.27 mmol/L (ref 1.15–1.40)
Chloride: 103 mmol/L (ref 98–111)
Creatinine, Ser: 1.3 mg/dL — ABNORMAL HIGH (ref 0.61–1.24)
Glucose, Bld: 100 mg/dL — ABNORMAL HIGH (ref 70–99)
HCT: 44 % (ref 39.0–52.0)
Hemoglobin: 15 g/dL (ref 13.0–17.0)
Potassium: 3.7 mmol/L (ref 3.5–5.1)
Sodium: 143 mmol/L (ref 135–145)
TCO2: 28 mmol/L (ref 22–32)

## 2019-08-29 SURGERY — EXAM UNDER ANESTHESIA, RECTUM
Anesthesia: Monitor Anesthesia Care | Site: Rectum

## 2019-08-29 MED ORDER — LIDOCAINE 2% (20 MG/ML) 5 ML SYRINGE
INTRAMUSCULAR | Status: AC
  Filled 2019-08-29: qty 5

## 2019-08-29 MED ORDER — KETOROLAC TROMETHAMINE 30 MG/ML IJ SOLN
INTRAMUSCULAR | Status: AC
  Filled 2019-08-29: qty 1

## 2019-08-29 MED ORDER — PROPOFOL 10 MG/ML IV BOLUS
INTRAVENOUS | Status: AC
  Filled 2019-08-29: qty 20

## 2019-08-29 MED ORDER — SODIUM CHLORIDE 0.9 % IV SOLN: 250.0000 mL | INTRAVENOUS | Status: DC | PRN

## 2019-08-29 MED ORDER — ACETAMINOPHEN 500 MG PO TABS
ORAL_TABLET | ORAL | Status: AC
  Filled 2019-08-29: qty 2

## 2019-08-29 MED ORDER — ONDANSETRON HCL 4 MG/2ML IJ SOLN: 4.0000 mg | Freq: Once | INTRAMUSCULAR | Status: DC | PRN

## 2019-08-29 MED ORDER — FENTANYL CITRATE (PF) 100 MCG/2ML IJ SOLN
INTRAMUSCULAR | Status: AC
  Filled 2019-08-29: qty 2

## 2019-08-29 MED ORDER — ONDANSETRON HCL 4 MG/2ML IJ SOLN
INTRAMUSCULAR | Status: AC
  Filled 2019-08-29: qty 2

## 2019-08-29 MED ORDER — ACETAMINOPHEN 325 MG RE SUPP: 650.0000 mg | RECTAL | Status: DC | PRN

## 2019-08-29 MED ORDER — PROPOFOL 500 MG/50ML IV EMUL
INTRAVENOUS | Status: AC
  Filled 2019-08-29: qty 50

## 2019-08-29 MED ORDER — LIDOCAINE 2% (20 MG/ML) 5 ML SYRINGE
INTRAMUSCULAR | Status: DC | PRN
  Administered 2019-08-29: 40 mg via INTRAVENOUS

## 2019-08-29 MED ORDER — BUPIVACAINE-EPINEPHRINE 0.5% -1:200000 IJ SOLN
INTRAMUSCULAR | Status: DC | PRN
  Administered 2019-08-29: 50 mL

## 2019-08-29 MED ORDER — DEXMEDETOMIDINE (PRECEDEX) IN NS 20 MCG/5ML (4 MCG/ML) IV SYRINGE
PREFILLED_SYRINGE | INTRAVENOUS | Status: DC | PRN
  Administered 2019-08-29 (×3): 4 ug via INTRAVENOUS

## 2019-08-29 MED ORDER — ACETAMINOPHEN 325 MG PO TABS: 325.0000 mg | ORAL_TABLET | ORAL | Status: DC | PRN

## 2019-08-29 MED ORDER — GLYCOPYRROLATE PF 0.2 MG/ML IJ SOSY
PREFILLED_SYRINGE | INTRAMUSCULAR | Status: DC | PRN
Start: 2019-08-29 — End: 2019-08-29
  Administered 2019-08-29: .2 mg via INTRAVENOUS

## 2019-08-29 MED ORDER — LACTATED RINGERS IV SOLN: INTRAVENOUS | Status: DC

## 2019-08-29 MED ORDER — BUPIVACAINE LIPOSOME 1.3 % IJ SUSP
INTRAMUSCULAR | Status: AC
  Filled 2019-08-29: qty 20

## 2019-08-29 MED ORDER — GLYCOPYRROLATE PF 0.2 MG/ML IJ SOSY
PREFILLED_SYRINGE | INTRAMUSCULAR | Status: AC
  Filled 2019-08-29: qty 1

## 2019-08-29 MED ORDER — ACETAMINOPHEN 160 MG/5ML PO SOLN: 325.0000 mg | ORAL | Status: DC | PRN

## 2019-08-29 MED ORDER — SODIUM CHLORIDE 0.9% FLUSH: 3.0000 mL | Freq: Two times a day (BID) | INTRAVENOUS | Status: DC

## 2019-08-29 MED ORDER — OXYCODONE HCL 5 MG PO TABS: 5.0000 mg | ORAL_TABLET | ORAL | Status: DC | PRN

## 2019-08-29 MED ORDER — OXYCODONE HCL 5 MG PO TABS: 5.0000 mg | ORAL_TABLET | Freq: Once | ORAL | Status: DC | PRN

## 2019-08-29 MED ORDER — PROPOFOL 10 MG/ML IV BOLUS
INTRAVENOUS | Status: DC | PRN
  Administered 2019-08-29: 40 mg via INTRAVENOUS

## 2019-08-29 MED ORDER — ACETAMINOPHEN 325 MG PO TABS: 650.0000 mg | ORAL_TABLET | ORAL | Status: DC | PRN

## 2019-08-29 MED ORDER — MIDAZOLAM HCL 2 MG/2ML IJ SOLN
INTRAMUSCULAR | Status: DC | PRN
  Administered 2019-08-29: 2 mg via INTRAVENOUS

## 2019-08-29 MED ORDER — BUPIVACAINE-EPINEPHRINE 0.5% -1:200000 IJ SOLN
INTRAMUSCULAR | Status: AC
  Filled 2019-08-29: qty 1

## 2019-08-29 MED ORDER — SODIUM CHLORIDE 0.9% FLUSH: 3.0000 mL | INTRAVENOUS | Status: DC | PRN

## 2019-08-29 MED ORDER — TRAMADOL HCL 50 MG PO TABS: 50.0000 mg | ORAL_TABLET | Freq: Four times a day (QID) | ORAL | 1 refills | Status: DC | PRN

## 2019-08-29 MED ORDER — PROPOFOL 500 MG/50ML IV EMUL
INTRAVENOUS | Status: DC | PRN
  Administered 2019-08-29: 200 ug/kg/min via INTRAVENOUS

## 2019-08-29 MED ORDER — MIDAZOLAM HCL 2 MG/2ML IJ SOLN
INTRAMUSCULAR | Status: AC
  Filled 2019-08-29: qty 2

## 2019-08-29 MED ORDER — LIDOCAINE 5 % EX OINT
TOPICAL_OINTMENT | CUTANEOUS | Status: AC
  Filled 2019-08-29: qty 35.44

## 2019-08-29 MED ORDER — ACETAMINOPHEN 500 MG PO TABS
1000.0000 mg | ORAL_TABLET | ORAL | Status: AC
  Administered 2019-08-29: 1000 mg via ORAL

## 2019-08-29 MED ORDER — OXYCODONE HCL 5 MG/5ML PO SOLN: 5.0000 mg | Freq: Once | ORAL | Status: DC | PRN

## 2019-08-29 MED ORDER — MEPERIDINE HCL 25 MG/ML IJ SOLN
6.2500 mg | INTRAMUSCULAR | Status: DC | PRN
Start: 2019-08-29 — End: 2019-08-29

## 2019-08-29 MED ORDER — DEXMEDETOMIDINE HCL IN NACL 200 MCG/50ML IV SOLN
INTRAVENOUS | Status: AC
  Filled 2019-08-29: qty 50

## 2019-08-29 MED ORDER — FENTANYL CITRATE (PF) 100 MCG/2ML IJ SOLN: 25.0000 ug | INTRAMUSCULAR | Status: DC | PRN

## 2019-08-29 SURGICAL SUPPLY — 50 items
APL SKNCLS STERI-STRIP NONHPOA (GAUZE/BANDAGES/DRESSINGS) ×1
BENZOIN TINCTURE PRP APPL 2/3 (GAUZE/BANDAGES/DRESSINGS) ×3 IMPLANT
BLADE EXTENDED COATED 6.5IN (ELECTRODE) IMPLANT
BLADE HEX COATED 2.75 (ELECTRODE) ×2 IMPLANT
BLADE SURG 10 STRL SS (BLADE) IMPLANT
BRIEF STRETCH FOR OB PAD LRG (UNDERPADS AND DIAPERS) ×2 IMPLANT
CANISTER SUCT 3000ML PPV (MISCELLANEOUS) ×2 IMPLANT
COVER BACK TABLE 60X90IN (DRAPES) ×2 IMPLANT
COVER MAYO STAND STRL (DRAPES) ×2 IMPLANT
COVER WAND RF STERILE (DRAPES) ×2 IMPLANT
DECANTER SPIKE VIAL GLASS SM (MISCELLANEOUS) ×2 IMPLANT
DRAPE LAPAROTOMY 100X72 PEDS (DRAPES) ×2 IMPLANT
DRAPE UTILITY XL STRL (DRAPES) ×2 IMPLANT
DRSG PAD ABDOMINAL 8X10 ST (GAUZE/BANDAGES/DRESSINGS) ×2 IMPLANT
ELECT REM PT RETURN 9FT ADLT (ELECTROSURGICAL) ×2
ELECTRODE REM PT RTRN 9FT ADLT (ELECTROSURGICAL) ×1 IMPLANT
GAUZE SPONGE 4X4 12PLY STRL (GAUZE/BANDAGES/DRESSINGS) ×2 IMPLANT
GAUZE SPONGE 4X4 12PLY STRL LF (GAUZE/BANDAGES/DRESSINGS) ×1 IMPLANT
GLOVE BIO SURGEON STRL SZ 6.5 (GLOVE) ×2 IMPLANT
GLOVE BIOGEL PI IND STRL 7.0 (GLOVE) ×1 IMPLANT
GLOVE BIOGEL PI INDICATOR 7.0 (GLOVE) ×1
GOWN STRL REUS W/TWL XL LVL3 (GOWN DISPOSABLE) ×2 IMPLANT
HYDROGEN PEROXIDE 16OZ (MISCELLANEOUS) ×1 IMPLANT
IV CATH 14GX2 1/4 (CATHETERS) ×1 IMPLANT
IV CATH 18G SAFETY (IV SOLUTION) ×1 IMPLANT
KIT SIGMOIDOSCOPE (SET/KITS/TRAYS/PACK) IMPLANT
KIT TURNOVER CYSTO (KITS) ×2 IMPLANT
LOOP VESSEL MAXI BLUE (MISCELLANEOUS) ×1 IMPLANT
NEEDLE HYPO 22GX1.5 SAFETY (NEEDLE) ×2 IMPLANT
NS IRRIG 500ML POUR BTL (IV SOLUTION) ×2 IMPLANT
PACK BASIN DAY SURGERY FS (CUSTOM PROCEDURE TRAY) ×2 IMPLANT
PAD ARMBOARD 7.5X6 YLW CONV (MISCELLANEOUS) IMPLANT
PENCIL SMOKE EVACUATOR (MISCELLANEOUS) ×2 IMPLANT
SPONGE HEMORRHOID 8X3CM (HEMOSTASIS) IMPLANT
SPONGE SURGIFOAM ABS GEL 12-7 (HEMOSTASIS) IMPLANT
SUCTION FRAZIER HANDLE 10FR (MISCELLANEOUS)
SUCTION TUBE FRAZIER 10FR DISP (MISCELLANEOUS) IMPLANT
SUT CHROMIC 2 0 SH (SUTURE) IMPLANT
SUT CHROMIC 3 0 SH 27 (SUTURE) IMPLANT
SUT ETHIBOND 0 (SUTURE) ×1 IMPLANT
SUT VIC AB 2-0 SH 27 (SUTURE)
SUT VIC AB 2-0 SH 27XBRD (SUTURE) IMPLANT
SUT VIC AB 3-0 SH 18 (SUTURE) IMPLANT
SUT VIC AB 3-0 SH 27 (SUTURE)
SUT VIC AB 3-0 SH 27XBRD (SUTURE) IMPLANT
SYR CONTROL 10ML LL (SYRINGE) ×2 IMPLANT
TOWEL OR 17X26 10 PK STRL BLUE (TOWEL DISPOSABLE) ×2 IMPLANT
TRAY DSU PREP LF (CUSTOM PROCEDURE TRAY) ×2 IMPLANT
TUBE CONNECTING 12X1/4 (SUCTIONS) ×2 IMPLANT
YANKAUER SUCT BULB TIP NO VENT (SUCTIONS) ×2 IMPLANT

## 2019-08-29 NOTE — Anesthesia Postprocedure Evaluation (Signed)
Anesthesia Post Note  Patient: Randall Trujillo  Procedure(s) Performed: ANAL EXAM UNDER ANESTHESIA (N/A Rectum) SETON PLACEMENT (N/A Rectum)     Patient location during evaluation: PACU Anesthesia Type: MAC Level of consciousness: awake and alert Pain management: pain level controlled Vital Signs Assessment: post-procedure vital signs reviewed and stable Respiratory status: spontaneous breathing, nonlabored ventilation, respiratory function stable and patient connected to nasal cannula oxygen Cardiovascular status: stable and blood pressure returned to baseline Postop Assessment: no apparent nausea or vomiting Anesthetic complications: no   No complications documented.  Last Vitals:  Vitals:   08/29/19 1000 08/29/19 1024  BP: (!) 150/90 (!) 152/100  Pulse: 62 65  Resp: 15 16  Temp:  36.7 C  SpO2: 99% 100%    Last Pain:  Vitals:   08/29/19 1024  TempSrc:   PainSc: 0-No pain                 Paityn Balsam

## 2019-08-29 NOTE — Discharge Instructions (Addendum)
Beginning the day after surgery:  You may sit in a tub of warm water 2-3 times a day to relieve discomfort.  Eat a regular diet high in fiber.  Avoid foods that give you constipation or diarrhea.  Avoid foods that are difficult to digest, such as seeds, nuts, corn or popcorn.  Do not go any longer than 2 days without a bowel movement.  You may take a dose of Milk of Magnesia if you become constipated.    Drink 6-8 glasses of water daily.  Walking is encouraged.  Avoid strenuous activity and heavy lifting for one month after surgery.    Call the office if you have any questions or concerns.  Call immediately if you develop:   Excessive rectal bleeding (more than a cup or passing large clots)  Increased discomfort  Fever greater than 100 F  Difficulty urinating   Post Anesthesia Home Care Instructions  Activity: Get plenty of rest for the remainder of the day. A responsible individual must stay with you for 24 hours following the procedure.  For the next 24 hours, DO NOT: -Drive a car -Advertising copywriter -Drink alcoholic beverages -Take any medication unless instructed by your physician -Make any legal decisions or sign important papers.  Meals: Start with liquid foods such as gelatin or soup. Progress to regular foods as tolerated. Avoid greasy, spicy, heavy foods. If nausea and/or vomiting occur, drink only clear liquids until the nausea and/or vomiting subsides. Call your physician if vomiting continues.  Special Instructions/Symptoms: Your throat may feel dry or sore from the anesthesia or the breathing tube placed in your throat during surgery. If this causes discomfort, gargle with warm salt water. The discomfort should disappear within 24 hours.  May take Tylenol at 1 PM as needed for pain. Repeat every 6 hours as needed to supplement prescription medication.  May take Ibuprofen, Advil Motrin, or Aleve as needed for pain.

## 2019-08-29 NOTE — Op Note (Signed)
08/29/2019  8:55 AM  PATIENT:  Randall Trujillo  44 y.o. male  Patient Care Team: Myer Peer, MD as PCP - General (Unknown Physician Specialty)  PRE-OPERATIVE DIAGNOSIS:  ANAL FISTULA  POST-OPERATIVE DIAGNOSIS:  ANAL FISTULA  PROCEDURE:   ANAL EXAM UNDER ANESTHESIA  SETON PLACEMENT    Surgeon(s): Leighton Ruff, MD  ASSISTANT: none   ANESTHESIA:   local and MAC  SPECIMEN:  No Specimen  DISPOSITION OF SPECIMEN:  N/A  COUNTS:  YES  PLAN OF CARE: Discharge to home after PACU  PATIENT DISPOSITION:  PACU - hemodynamically stable.  INDICATION: 44 year old male who presents to the office with complaints of perirectal drainage and pain.  He was felt to have a perianal fistula.  I recommended exam under anesthesia with fistulotomy versus seton placement   OR FINDINGS: Right anterior lateral transsphincteric anal fistula involving entire external sphincter complex and approximately 20% of internal sphincter  DESCRIPTION: the patient was identified in the preoperative holding area and taken to the OR where they were laid on the operating room table.  MAC anesthesia was induced without difficulty. The patient was then positioned in prone jackknife position with buttocks gently taped apart.  The patient was then prepped and draped in usual sterile fashion.  SCDs were noted to be in place prior to the initiation of anesthesia. A surgical timeout was performed indicating the correct patient, procedure, positioning and need for preoperative antibiotics.  A rectal block was performed using Marcaine with epinephrine.    I began with a digital rectal exam.  There were no masses noted.  Sphincter tone was normal.  I then placed a Hill-Ferguson anoscope into the anal canal and evaluated this completely.  There were no pathologic findings noted.  I placed an S shaped fistula probe into the external opening in the right anterior lateral perianal region.  This traversed under the entire external  sphincter complex and exited at the dentate line in the right anterior lateral anal canal.  This involved approximately 20% of his internal sphincter as well.  I decided to place a seton.  An Ethibond suture was tied to the end of the fistula probe and brought through the fistula tract.  A vessel loop was then brought back through the fistula tract using the Ethibond suture.  I then used the Ethibond suture to secure the vessel loop into a circle, creating the seton.  Additional Marcaine was placed around the entrance site.  The patient was then awakened from anesthesia and sent to the postanesthesia care unit in stable condition.  All counts were correct per operating room staff.

## 2019-08-29 NOTE — H&P (Signed)
  The patient is a 44 year old male who presents with a complaint of Mass. 44 year old male who presents to the office with a mass noticed on his buttock. Over the past few months. He reports that it opens up and drained purulence and then closes. Then this reoccurs. He has never had anything like this before.   Diagnostic Studies History Renee Ramus, CMA; 07/09/2019 1:26 PM) Colonoscopy 5-10 years ago  Allergies Renee Ramus, CMA; 07/09/2019 1:27 PM) No Known Drug Allergies [07/09/2019]:  Medication History (Armen Ferguson, CMA; 07/09/2019 1:27 PM) Divalproex Sodium ER (500MG  Tablet ER 24HR, Oral) Active. Medications Reconciled  Social History , CMA; 07/09/2019 1:26 PM) Alcohol use Occasional alcohol use. Caffeine use Tea. No drug use Tobacco use Former smoker.  Family History 07/11/2019, CMA; 07/09/2019 1:26 PM) Breast Cancer Mother. Diabetes Mellitus Father, Mother. Prostate Cancer Father.  Other Problems 07/11/2019, CMA; 07/09/2019 1:26 PM) Seizure Disorder     Review of Systems (Armen Ferguson CMA; 07/09/2019 1:26 PM) General Not Present- Appetite Loss, Chills, Fatigue, Fever, Night Sweats, Weight Gain and Weight Loss. Breast Not Present- Breast Mass, Breast Pain, Nipple Discharge and Skin Changes. Gastrointestinal Not Present- Abdominal Pain, Bloating, Bloody Stool, Change in Bowel Habits, Chronic diarrhea, Constipation, Difficulty Swallowing, Excessive gas, Gets full quickly at meals, Hemorrhoids, Indigestion, Nausea, Rectal Pain and Vomiting. Male Genitourinary Not Present- Blood in Urine, Change in Urinary Stream, Frequency, Impotence, Nocturia, Painful Urination, Urgency and Urine Leakage. Psychiatric Not Present- Anxiety, Bipolar, Change in Sleep Pattern, Depression, Fearful and Frequent crying. Endocrine Not Present- Cold Intolerance, Excessive Hunger, Hair Changes, Heat Intolerance, Hot flashes and New  Diabetes. Hematology Not Present- Blood Thinners, Easy Bruising, Excessive bleeding, Gland problems, HIV and Persistent Infections.  BP (!) 139/93   Pulse 61   Temp 97.6 F (36.4 C) (Oral)   Resp 16   Ht 6\' 2"  (1.88 m)   Wt 97.1 kg   SpO2 96%   BMI 27.48 kg/m      Physical Exam   General Mental Status-Alert. General Appearance-Cooperative. 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 I recommended an exam under anesthesia with possible fistulotomy versus seton placement. We discussed fistulotomy as the best way to get rid of these, but this can result in incontinence of more than 20% of the anal sphincter was divided. If this is the case, we will proceed with seton placement and allow this to remain for approximately 3 months. We would then proceed with a ligation of internal fistula tract, which has approximately 80% success rate. We discussed the typical postoperative healing times above procedures. Risk of pain and bleeding were also discussed. All questions were answered.

## 2019-08-29 NOTE — Transfer of Care (Signed)
Immediate Anesthesia Transfer of Care Note  Patient: Randall Trujillo  Procedure(s) Performed: Procedure(s) (LRB): ANAL EXAM UNDER ANESTHESIA (N/A) SETON PLACEMENT (N/A)  Patient Location: PACU  Anesthesia Type: MAC  Level of Consciousness: awake, alert , oriented and patient cooperative  Airway & Oxygen Therapy: Patient Spontanous Breathing and Patient connected to face mask oxygen  Post-op Assessment: Report given to PACU RN and Post -op Vital signs reviewed and stable  Post vital signs: Reviewed and stable  Complications: No apparent anesthesia complications Last Vitals:  Vitals Value Taken Time  BP 139/76 08/29/19 0907  Temp 36.4 C 08/29/19 0907  Pulse 73 08/29/19 0907  Resp 16 08/29/19 0907  SpO2 98 % 08/29/19 0907  Vitals shown include unvalidated device data.  Last Pain:  Vitals:   08/29/19 0702  TempSrc: Oral  PainSc: 0-No pain      Patients Stated Pain Goal: 2 (08/29/19 4650)

## 2019-08-30 ENCOUNTER — Encounter (HOSPITAL_BASED_OUTPATIENT_CLINIC_OR_DEPARTMENT_OTHER): Payer: Self-pay | Admitting: General Surgery

## 2019-10-16 ENCOUNTER — Other Ambulatory Visit: Payer: Self-pay | Admitting: Sports Medicine

## 2019-10-16 ENCOUNTER — Ambulatory Visit (INDEPENDENT_AMBULATORY_CARE_PROVIDER_SITE_OTHER): Payer: 59 | Admitting: Sports Medicine

## 2019-10-16 ENCOUNTER — Encounter: Payer: Self-pay | Admitting: Sports Medicine

## 2019-10-16 ENCOUNTER — Ambulatory Visit (INDEPENDENT_AMBULATORY_CARE_PROVIDER_SITE_OTHER): Payer: 59

## 2019-10-16 ENCOUNTER — Other Ambulatory Visit: Payer: Self-pay

## 2019-10-16 DIAGNOSIS — M79672 Pain in left foot: Secondary | ICD-10-CM

## 2019-10-16 DIAGNOSIS — M779 Enthesopathy, unspecified: Secondary | ICD-10-CM

## 2019-10-16 DIAGNOSIS — M19072 Primary osteoarthritis, left ankle and foot: Secondary | ICD-10-CM

## 2019-10-16 DIAGNOSIS — M778 Other enthesopathies, not elsewhere classified: Secondary | ICD-10-CM

## 2019-10-16 DIAGNOSIS — M25572 Pain in left ankle and joints of left foot: Secondary | ICD-10-CM | POA: Diagnosis not present

## 2019-10-16 MED ORDER — MELOXICAM 15 MG PO TABS
15.0000 mg | ORAL_TABLET | Freq: Every day | ORAL | 0 refills | Status: DC
Start: 2019-10-16 — End: 2019-11-20

## 2019-10-16 MED ORDER — TRIAMCINOLONE ACETONIDE 10 MG/ML IJ SUSP
10.0000 mg | Freq: Once | INTRAMUSCULAR | Status: AC
  Administered 2019-10-16: 10 mg

## 2019-10-16 NOTE — Progress Notes (Signed)
Subjective: Randall Trujillo is a 44 y.o. male patient who presents to office for evaluation of left foot pain. Patient complains of progressive pain especially over the last week in the left foot at the dorsal foot that radiates to the medial side and up the leg.  Ranks pain 9/10 and is now interferring with daily activities. Patient has tried elevation and getting new work boots with no relief in symptoms. Patient denies any other pedal complaints. Denies injury/trip/fall/sprain/any causative factors.   Review of systems noncontributory  Patient Active Problem List   Diagnosis Date Noted  . Encounter for therapeutic drug monitoring 04/26/2016  . Generalized tonic clonic epilepsy (HCC)   . Partial epilepsy with impairment of consciousness [facility])     Current Outpatient Medications on File Prior to Visit  Medication Sig Dispense Refill  . divalproex (DEPAKOTE ER) 500 MG 24 hr tablet Take 3 tablets (1,500 mg total) by mouth at bedtime. PLEASE CALL OFFICE TO SCHEDULE YEARLY APPT FOR CONTINUED REFILLS. 270 tablet 4   No current facility-administered medications on file prior to visit.    Allergies  Allergen Reactions  . Dilantin [Phenytoin Sodium Extended] Rash    Objective:  General: Alert and oriented x3 in no acute distress  Dermatology: No open lesions bilateral lower extremities, no webspace macerations, no ecchymosis bilateral, all nails x 10 are well manicured.  Vascular: Dorsalis Pedis and Posterior Tibial pedal pulses palpable, Capillary Fill Time 3 seconds,(+) pedal hair growth bilateral, minimal edema dorsal lateral foot on left temperature gradient within normal limits.  Neurology: Michaell Cowing sensation intact via light touch bilateral.  Musculoskeletal: Mild to moderate tenderness with palpation at lateral foot and ankle at the sinus tarsi with subjective pain over the extensor tendons to the left ankle,No pain with calf compression bilateral.  There is decreased midfoot range of  motion and guarding on the left.  Pes planus foot type bilateral.  Strength within normal limits in all groups bilateral.   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  No fracture or dislocation.  Assessment and Plan: Problem List Items Addressed This Visit    None    Visit Diagnoses    Capsulitis of left foot    -  Primary   Sinus tarsi syndrome of left foot       Left foot pain       Arthritis of foot, left       Relevant Medications   meloxicam (MOBIC) 15 MG tablet       -Complete examination performed -Xrays reviewed -Discussed treatement options for likely capsulitis with underlying tendinitis versus arthritis -After oral consent and aseptic prep, injected a mixture containing 1 ml of 2%  plain lidocaine, 1 ml 0.5% plain marcaine, 0.5 ml of kenalog 10 and 0.5 ml of dexamethasone phosphate into left dorsolateral foot over the sinus tarsi without complication. Post-injection care discussed with patient.  -Prescribed meloxicam to take as instructed -Dispensed Tri-Lock ankle support -Advised good supportive shoes daily for foot type -Patient to return to office 3 to 4 weeks or sooner if condition worsens.  Asencion Islam, DPM

## 2019-10-29 ENCOUNTER — Ambulatory Visit: Payer: Self-pay | Admitting: General Surgery

## 2019-10-29 NOTE — H&P (Signed)
   The patient is a 44 year old male who presents with a complaint of Mass. 44-year-old male who presented to the office with a mass noticed on his buttock. It was felt to be the opening of an external fistula tract. He was taken to the operating room on August 29, 2019 for exam under anesthesia. 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 An approximately 20% of the internal complex. A seton was placed. Patient has had some difficulty with pain postoperatively but is getting better now.    Problem List/Past Medical (Crystalmarie Yasin, MD; 10/29/2019 3:01 PM) ANAL FISTULA (K60.3)  Diagnostic Studies History (Linkin Vizzini, MD; 10/29/2019 3:01 PM) Colonoscopy 5-10 years ago  Allergies (Qiana Landgrebe, MD; 10/29/2019 3:01 PM) No Known Drug Allergies [07/09/2019]:  Medication History (Armen Ferguson, CMA; 10/29/2019 2:49 PM) Meloxicam (15MG Tablet, Oral) Active. Divalproex Sodium ER (500MG Tablet ER 24HR, Oral) Active. Medications Reconciled  Social History (Miroslav Gin, MD; 10/29/2019 3:01 PM) Alcohol use Occasional alcohol use. Caffeine use Tea. No drug use Tobacco use Former smoker.  Family History (Zaniyah Wernette, MD; 10/29/2019 3:01 PM) Breast Cancer Mother. Diabetes Mellitus Father, Mother. Prostate Cancer Father.  Other Problems (Lizvet Chunn, MD; 10/29/2019 3:01 PM) Seizure Disorder     Review of Systems (Eulalia Ellerman MD; 10/29/2019 3:01 PM) General Not Present- Appetite Loss, Chills, Fatigue, Fever, Night Sweats, Weight Gain and Weight Loss. Breast Not Present- Breast Mass, Breast Pain, Nipple Discharge and Skin Changes. Gastrointestinal Not Present- Abdominal Pain, Bloating, Bloody Stool, Change in Bowel Habits, Chronic diarrhea, Constipation, Difficulty Swallowing, Excessive gas, Gets full quickly at meals, Hemorrhoids, Indigestion, Nausea, Rectal Pain and Vomiting. Male Genitourinary Not Present-  Blood in Urine, Change in Urinary Stream, Frequency, Impotence, Nocturia, Painful Urination, Urgency and Urine Leakage. Psychiatric Not Present- Anxiety, Bipolar, Change in Sleep Pattern, Depression, Fearful and Frequent crying. Endocrine Not Present- Cold Intolerance, Excessive Hunger, Hair Changes, Heat Intolerance and New Diabetes. Hematology Not Present- Blood Thinners, Easy Bruising, Excessive bleeding, Gland problems, HIV and Persistent Infections.  Vitals (Armen Ferguson CMA; 10/29/2019 2:48 PM) 10/29/2019 2:48 PM Weight: 213.5 lb Height: 73in Body Surface Area: 2.21 m Body Mass Index: 28.17 kg/m  Temp.: 98.6F  Pulse: 98 (Regular)  P.OX: 96% (Room air) BP: 156/86(Sitting, Left Arm, Standard)        Physical Exam (Valyncia Wiens MD; 10/29/2019 3:01 PM)  General Mental Status-Alert. General Appearance-Cooperative.  Rectal Note: Seton in place in left anterior lateral anal canal. Healing appropriately.    Assessment & Plan (Marbella Markgraf MD; 10/29/2019 3:01 PM)  ANAL FISTULA (K60.3) Impression: 44-year-old male status post seton placement for a transsphincteric right anterior fistula. I have recommended ligation of internal fistula tract as a definitive repair for his fistula. We discussed the typical recovery time and pain after surgery. We discussed that he will need to be gentle with the area for approximately 3-4 weeks as things heal. This includes minimizing his diarrhea and constipation as much as possible. We discussed that there is approximately 20% of recurrence. There is a much smaller risk of sphincter damage. 

## 2019-10-29 NOTE — H&P (View-Only) (Signed)
   The patient is a 44 year old male who presents with a complaint of Mass. 44 year old male who presented to the office with a mass noticed on his buttock. It was felt to be the opening of an external fistula tract. He was taken to the operating room on August 29, 2019 for exam under anesthesia. 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 An approximately 20% of the internal complex. A seton was placed. Patient has had some difficulty with pain postoperatively but is getting better now.    Problem List/Past Medical Romie Levee, MD; 10/29/2019 3:01 PM) ANAL FISTULA (K60.3)  Diagnostic Studies History Romie Levee, MD; 10/29/2019 3:01 PM) Colonoscopy 5-10 years ago  Allergies Romie Levee, MD; 10/29/2019 3:01 PM) No Known Drug Allergies [07/09/2019]:  Medication History Renee Ramus, CMA; 10/29/2019 2:49 PM) Meloxicam (15MG  Tablet, Oral) Active. Divalproex Sodium ER (500MG  Tablet ER 24HR, Oral) Active. Medications Reconciled  Social History , MD; 10/29/2019 3:01 PM) Alcohol use Occasional alcohol use. Caffeine use Tea. No drug use Tobacco use Former smoker.  Family History Romie Levee, MD; 10/29/2019 3:01 PM) Breast Cancer Mother. Diabetes Mellitus Father, Mother. Prostate Cancer Father.  Other Problems Romie Levee, MD; 10/29/2019 3:01 PM) Seizure Disorder     Review of Systems Romie Levee MD; 10/29/2019 3:01 PM) General Not Present- Appetite Loss, Chills, Fatigue, Fever, Night Sweats, Weight Gain and Weight Loss. Breast Not Present- Breast Mass, Breast Pain, Nipple Discharge and Skin Changes. Gastrointestinal Not Present- Abdominal Pain, Bloating, Bloody Stool, Change in Bowel Habits, Chronic diarrhea, Constipation, Difficulty Swallowing, Excessive gas, Gets full quickly at meals, Hemorrhoids, Indigestion, Nausea, Rectal Pain and Vomiting. Male Genitourinary Not Present-  Blood in Urine, Change in Urinary Stream, Frequency, Impotence, Nocturia, Painful Urination, Urgency and Urine Leakage. Psychiatric Not Present- Anxiety, Bipolar, Change in Sleep Pattern, Depression, Fearful and Frequent crying. Endocrine Not Present- Cold Intolerance, Excessive Hunger, Hair Changes, Heat Intolerance and New Diabetes. Hematology Not Present- Blood Thinners, Easy Bruising, Excessive bleeding, Gland problems, HIV and Persistent Infections.  Vitals (Armen Ferguson CMA; 10/29/2019 2:48 PM) 10/29/2019 2:48 PM Weight: 213.5 lb Height: 73in Body Surface Area: 2.21 m Body Mass Index: 28.17 kg/m  Temp.: 98.71F  Pulse: 98 (Regular)  P.OX: 96% (Room air) BP: 156/86(Sitting, Left Arm, Standard)        Physical Exam 10/31/2019 MD; 10/29/2019 3:01 PM)  General Mental Status-Alert. General Appearance-Cooperative.  Rectal Note: Romie Levee in place in left anterior lateral anal canal. Healing appropriately.    Assessment & Plan 10/31/2019 MD; 10/29/2019 3:01 PM)  ANAL FISTULA (K60.3) Impression: 44 year old male status post seton placement for a transsphincteric right anterior fistula. I have recommended ligation of internal fistula tract as a definitive repair for his fistula. We discussed the typical recovery time and pain after surgery. We discussed that he will need to be gentle with the area for approximately 3-4 weeks as things heal. This includes minimizing his diarrhea and constipation as much as possible. We discussed that there is approximately 20% of recurrence. There is a much smaller risk of sphincter damage.

## 2019-11-16 ENCOUNTER — Ambulatory Visit: Payer: 59 | Admitting: Sports Medicine

## 2019-11-16 ENCOUNTER — Encounter (HOSPITAL_BASED_OUTPATIENT_CLINIC_OR_DEPARTMENT_OTHER): Payer: Self-pay | Admitting: General Surgery

## 2019-11-17 ENCOUNTER — Other Ambulatory Visit (HOSPITAL_COMMUNITY)
Admission: RE | Admit: 2019-11-17 | Discharge: 2019-11-17 | Disposition: A | Discharge status: Home / Self Care | Payer: 59 | Source: Ambulatory Visit | Attending: General Surgery | Admitting: General Surgery

## 2019-11-17 DIAGNOSIS — Z01812 Encounter for preprocedural laboratory examination: Secondary | ICD-10-CM | POA: Diagnosis present

## 2019-11-17 DIAGNOSIS — Z20822 Contact with and (suspected) exposure to covid-19: Secondary | ICD-10-CM | POA: Insufficient documentation

## 2019-11-17 LAB — SARS CORONAVIRUS 2 (TAT 6-24 HRS): SARS Coronavirus 2: NEGATIVE

## 2019-11-20 ENCOUNTER — Encounter (HOSPITAL_BASED_OUTPATIENT_CLINIC_OR_DEPARTMENT_OTHER): Payer: Self-pay | Admitting: General Surgery

## 2019-11-20 ENCOUNTER — Other Ambulatory Visit: Payer: Self-pay

## 2019-11-20 NOTE — Progress Notes (Signed)
Spoke w/ via phone for pre-op interview---pt Lab needs dos----none               Lab results------none COVID test ------11-17-2019 Arrive at -------1230 pm NPO after MN NO Solid Food.  Clear liquids from MN until---1130 am then npo Medications to take morning of surgery -----none Diabetic medication -----n/a Patient Special Instructions -----none Pre-Op special Istructions -----none Patient verbalized understanding of instructions that were given at this phone interview. Patient denies shortness of breath, chest pain, fever, cough at this phone interview.

## 2019-11-20 NOTE — Anesthesia Preprocedure Evaluation (Addendum)
Anesthesia Evaluation  Patient identified by MRN, date of birth, ID band Patient awake    Reviewed: Allergy & Precautions, NPO status , Patient's Chart, lab work & pertinent test results  Airway Mallampati: II  TM Distance: >3 FB Neck ROM: Full    Dental no notable dental hx. (+) Teeth Intact, Dental Advisory Given   Pulmonary former smoker,    Pulmonary exam normal breath sounds clear to auscultation       Cardiovascular Exercise Tolerance: Good negative cardio ROS Normal cardiovascular exam Rhythm:Regular Rate:Normal     Neuro/Psych Seizures -, Well Controlled,  negative psych ROS   GI/Hepatic negative GI ROS, Neg liver ROS,   Endo/Other  negative endocrine ROS  Renal/GU negative Renal ROS     Musculoskeletal negative musculoskeletal ROS (+)   Abdominal   Peds  Hematology negative hematology ROS (+)   Anesthesia Other Findings   Reproductive/Obstetrics                            Anesthesia Physical Anesthesia Plan  ASA: II  Anesthesia Plan: General   Post-op Pain Management:    Induction: Intravenous  PONV Risk Score and Plan: 2 and Treatment may vary due to age or medical condition, Ondansetron, Dexamethasone and Midazolam  Airway Management Planned: Oral ETT  Additional Equipment: None  Intra-op Plan:   Post-operative Plan: Extubation in OR  Informed Consent: I have reviewed the patients History and Physical, chart, labs and discussed the procedure including the risks, benefits and alternatives for the proposed anesthesia with the patient or authorized representative who has indicated his/her understanding and acceptance.     Dental advisory given  Plan Discussed with: CRNA and Anesthesiologist  Anesthesia Plan Comments:       Anesthesia Quick Evaluation

## 2019-11-21 ENCOUNTER — Ambulatory Visit (HOSPITAL_BASED_OUTPATIENT_CLINIC_OR_DEPARTMENT_OTHER): Payer: 59 | Admitting: Anesthesiology

## 2019-11-21 ENCOUNTER — Ambulatory Visit (HOSPITAL_BASED_OUTPATIENT_CLINIC_OR_DEPARTMENT_OTHER)
Admission: RE | Admit: 2019-11-21 | Discharge: 2019-11-21 | Disposition: A | Discharge status: Home / Self Care | Payer: 59 | Attending: General Surgery | Admitting: General Surgery

## 2019-11-21 ENCOUNTER — Encounter (HOSPITAL_BASED_OUTPATIENT_CLINIC_OR_DEPARTMENT_OTHER)
Admission: RE | Disposition: A | Discharge status: Home / Self Care | Payer: Self-pay | Source: Home / Self Care | Attending: General Surgery

## 2019-11-21 ENCOUNTER — Encounter (HOSPITAL_BASED_OUTPATIENT_CLINIC_OR_DEPARTMENT_OTHER): Payer: Self-pay | Admitting: General Surgery

## 2019-11-21 DIAGNOSIS — K603 Anal fistula: Secondary | ICD-10-CM | POA: Diagnosis present

## 2019-11-21 DIAGNOSIS — Z79899 Other long term (current) drug therapy: Secondary | ICD-10-CM | POA: Insufficient documentation

## 2019-11-21 DIAGNOSIS — Z87891 Personal history of nicotine dependence: Secondary | ICD-10-CM | POA: Diagnosis not present

## 2019-11-21 DIAGNOSIS — Z8042 Family history of malignant neoplasm of prostate: Secondary | ICD-10-CM | POA: Insufficient documentation

## 2019-11-21 DIAGNOSIS — Z833 Family history of diabetes mellitus: Secondary | ICD-10-CM | POA: Insufficient documentation

## 2019-11-21 DIAGNOSIS — G40909 Epilepsy, unspecified, not intractable, without status epilepticus: Secondary | ICD-10-CM | POA: Diagnosis not present

## 2019-11-21 DIAGNOSIS — Z803 Family history of malignant neoplasm of breast: Secondary | ICD-10-CM | POA: Diagnosis not present

## 2019-11-21 HISTORY — DX: Anal fistula: K60.3

## 2019-11-21 HISTORY — PX: LIGATION OF INTERNAL FISTULA TRACT: SHX6551

## 2019-11-21 HISTORY — DX: Anal fistula, unspecified: K60.30

## 2019-11-21 SURGERY — LIGATION, INTERNAL FISTULA TRACT
Anesthesia: Monitor Anesthesia Care | Site: Rectum

## 2019-11-21 MED ORDER — LIDOCAINE HCL (CARDIAC) PF 100 MG/5ML IV SOSY
PREFILLED_SYRINGE | INTRAVENOUS | Status: DC | PRN
  Administered 2019-11-21: 100 mg via INTRAVENOUS

## 2019-11-21 MED ORDER — PROPOFOL 500 MG/50ML IV EMUL
INTRAVENOUS | Status: DC | PRN
  Administered 2019-11-21: 150 ug/kg/min via INTRAVENOUS

## 2019-11-21 MED ORDER — ACETAMINOPHEN 500 MG PO TABS
ORAL_TABLET | ORAL | Status: AC
  Filled 2019-11-21: qty 2

## 2019-11-21 MED ORDER — SUCCINYLCHOLINE CHLORIDE 200 MG/10ML IV SOSY
PREFILLED_SYRINGE | INTRAVENOUS | Status: AC
  Filled 2019-11-21: qty 10

## 2019-11-21 MED ORDER — DEXMEDETOMIDINE (PRECEDEX) IN NS 20 MCG/5ML (4 MCG/ML) IV SYRINGE
PREFILLED_SYRINGE | INTRAVENOUS | Status: AC
  Filled 2019-11-21: qty 5

## 2019-11-21 MED ORDER — LACTATED RINGERS IV SOLN
INTRAVENOUS | Status: DC
  Administered 2019-11-21: 50 mL/h via INTRAVENOUS

## 2019-11-21 MED ORDER — DEXMEDETOMIDINE HCL 200 MCG/2ML IV SOLN
INTRAVENOUS | Status: DC | PRN
  Administered 2019-11-21 (×2): 8 ug via INTRAVENOUS
  Administered 2019-11-21: 4 ug via INTRAVENOUS

## 2019-11-21 MED ORDER — BUPIVACAINE LIPOSOME 1.3 % IJ SUSP: 20.0000 mL | Freq: Once | INTRAMUSCULAR | Status: DC

## 2019-11-21 MED ORDER — GABAPENTIN 300 MG PO CAPS
300.0000 mg | ORAL_CAPSULE | ORAL | Status: AC
  Administered 2019-11-21: 300 mg via ORAL

## 2019-11-21 MED ORDER — KETOROLAC TROMETHAMINE 30 MG/ML IJ SOLN
INTRAMUSCULAR | Status: DC | PRN
  Administered 2019-11-21: 30 mg via INTRAVENOUS

## 2019-11-21 MED ORDER — OXYCODONE HCL 5 MG/5ML PO SOLN: 5.0000 mg | Freq: Once | ORAL | Status: DC | PRN

## 2019-11-21 MED ORDER — OXYCODONE HCL 5 MG PO TABS: 5.0000 mg | ORAL_TABLET | Freq: Four times a day (QID) | ORAL | 0 refills | Status: DC | PRN

## 2019-11-21 MED ORDER — HYDROMORPHONE HCL 1 MG/ML IJ SOLN: 0.2500 mg | INTRAMUSCULAR | Status: DC | PRN

## 2019-11-21 MED ORDER — PROPOFOL 10 MG/ML IV BOLUS
INTRAVENOUS | Status: AC
  Filled 2019-11-21: qty 20

## 2019-11-21 MED ORDER — ACETAMINOPHEN 500 MG PO TABS
1000.0000 mg | ORAL_TABLET | ORAL | Status: AC
  Administered 2019-11-21: 1000 mg via ORAL

## 2019-11-21 MED ORDER — MIDAZOLAM HCL 2 MG/2ML IJ SOLN
INTRAMUSCULAR | Status: AC
  Filled 2019-11-21: qty 2

## 2019-11-21 MED ORDER — ONDANSETRON HCL 4 MG/2ML IJ SOLN
INTRAMUSCULAR | Status: DC | PRN
  Administered 2019-11-21: 4 mg via INTRAVENOUS

## 2019-11-21 MED ORDER — PHENYLEPHRINE 40 MCG/ML (10ML) SYRINGE FOR IV PUSH (FOR BLOOD PRESSURE SUPPORT)
PREFILLED_SYRINGE | INTRAVENOUS | Status: AC
  Filled 2019-11-21: qty 10

## 2019-11-21 MED ORDER — PROPOFOL 10 MG/ML IV BOLUS
INTRAVENOUS | Status: DC | PRN
  Administered 2019-11-21: 150 mg via INTRAVENOUS

## 2019-11-21 MED ORDER — LIDOCAINE 5 % EX OINT
TOPICAL_OINTMENT | CUTANEOUS | Status: DC | PRN
  Administered 2019-11-21: 1

## 2019-11-21 MED ORDER — ONDANSETRON HCL 4 MG/2ML IJ SOLN: 4.0000 mg | Freq: Once | INTRAMUSCULAR | Status: DC | PRN

## 2019-11-21 MED ORDER — PHENYLEPHRINE HCL (PRESSORS) 10 MG/ML IV SOLN
INTRAVENOUS | Status: DC | PRN
  Administered 2019-11-21 (×3): 80 ug via INTRAVENOUS

## 2019-11-21 MED ORDER — FENTANYL CITRATE (PF) 100 MCG/2ML IJ SOLN
INTRAMUSCULAR | Status: AC
  Filled 2019-11-21: qty 2

## 2019-11-21 MED ORDER — ONDANSETRON HCL 4 MG/2ML IJ SOLN
INTRAMUSCULAR | Status: AC
  Filled 2019-11-21: qty 2

## 2019-11-21 MED ORDER — KETOROLAC TROMETHAMINE 30 MG/ML IJ SOLN: 30.0000 mg | Freq: Once | INTRAMUSCULAR | Status: DC | PRN

## 2019-11-21 MED ORDER — BUPIVACAINE-EPINEPHRINE 0.5% -1:200000 IJ SOLN
INTRAMUSCULAR | Status: DC | PRN
  Administered 2019-11-21: 30 mL

## 2019-11-21 MED ORDER — LIDOCAINE 2% (20 MG/ML) 5 ML SYRINGE
INTRAMUSCULAR | Status: AC
  Filled 2019-11-21: qty 5

## 2019-11-21 MED ORDER — SODIUM CHLORIDE 0.9% FLUSH: 3.0000 mL | Freq: Two times a day (BID) | INTRAVENOUS | Status: DC

## 2019-11-21 MED ORDER — PROPOFOL 500 MG/50ML IV EMUL
INTRAVENOUS | Status: AC
  Filled 2019-11-21: qty 50

## 2019-11-21 MED ORDER — BUPIVACAINE LIPOSOME 1.3 % IJ SUSP
INTRAMUSCULAR | Status: DC | PRN
  Administered 2019-11-21: 20 mL

## 2019-11-21 MED ORDER — MIDAZOLAM HCL 5 MG/5ML IJ SOLN
INTRAMUSCULAR | Status: DC | PRN
  Administered 2019-11-21: 2 mg via INTRAVENOUS

## 2019-11-21 MED ORDER — SUCCINYLCHOLINE CHLORIDE 20 MG/ML IJ SOLN
INTRAMUSCULAR | Status: DC | PRN
  Administered 2019-11-21: 120 mg via INTRAVENOUS

## 2019-11-21 MED ORDER — GABAPENTIN 300 MG PO CAPS
ORAL_CAPSULE | ORAL | Status: AC
  Filled 2019-11-21: qty 1

## 2019-11-21 MED ORDER — OXYCODONE HCL 5 MG PO TABS: 5.0000 mg | ORAL_TABLET | Freq: Once | ORAL | Status: DC | PRN

## 2019-11-21 MED ORDER — DEXAMETHASONE SODIUM PHOSPHATE 10 MG/ML IJ SOLN
INTRAMUSCULAR | Status: AC
  Filled 2019-11-21: qty 1

## 2019-11-21 MED ORDER — KETOROLAC TROMETHAMINE 30 MG/ML IJ SOLN
INTRAMUSCULAR | Status: AC
  Filled 2019-11-21: qty 1

## 2019-11-21 MED ORDER — DEXAMETHASONE SODIUM PHOSPHATE 4 MG/ML IJ SOLN
INTRAMUSCULAR | Status: DC | PRN
  Administered 2019-11-21: 10 mg via INTRAVENOUS

## 2019-11-21 MED ORDER — FENTANYL CITRATE (PF) 100 MCG/2ML IJ SOLN
INTRAMUSCULAR | Status: DC | PRN
Start: 2019-11-21 — End: 2019-11-21
  Administered 2019-11-21 (×2): 50 ug via INTRAVENOUS

## 2019-11-21 SURGICAL SUPPLY — 56 items
APL SKNCLS STERI-STRIP NONHPOA (GAUZE/BANDAGES/DRESSINGS) ×1
BENZOIN TINCTURE PRP APPL 2/3 (GAUZE/BANDAGES/DRESSINGS) ×3 IMPLANT
BLADE EXTENDED COATED 6.5IN (ELECTRODE) IMPLANT
BLADE HEX COATED 2.75 (ELECTRODE) ×2 IMPLANT
BLADE SURG 10 STRL SS (BLADE) ×2 IMPLANT
BRIEF STRETCH FOR OB PAD LRG (UNDERPADS AND DIAPERS) ×2 IMPLANT
CANISTER SUCT 3000ML PPV (MISCELLANEOUS) ×1 IMPLANT
COVER BACK TABLE 60X90IN (DRAPES) ×2 IMPLANT
COVER MAYO STAND STRL (DRAPES) ×2 IMPLANT
COVER WAND RF STERILE (DRAPES) ×2 IMPLANT
DRAPE LAPAROTOMY 100X72 PEDS (DRAPES) ×2 IMPLANT
DRAPE UTILITY XL STRL (DRAPES) ×2 IMPLANT
DRSG PAD ABDOMINAL 8X10 ST (GAUZE/BANDAGES/DRESSINGS) ×2 IMPLANT
ELECT REM PT RETURN 9FT ADLT (ELECTROSURGICAL) ×2
ELECTRODE REM PT RTRN 9FT ADLT (ELECTROSURGICAL) ×1 IMPLANT
GAUZE SPONGE 4X4 12PLY STRL (GAUZE/BANDAGES/DRESSINGS) ×2 IMPLANT
GLOVE BIO SURGEON STRL SZ 6.5 (GLOVE) ×2 IMPLANT
GLOVE BIOGEL PI IND STRL 7.0 (GLOVE) ×1 IMPLANT
GLOVE BIOGEL PI INDICATOR 7.0 (GLOVE) ×1
GOWN STRL REUS W/TWL XL LVL3 (GOWN DISPOSABLE) ×2 IMPLANT
HYDROGEN PEROXIDE 16OZ (MISCELLANEOUS) ×2 IMPLANT
IV CATH 14GX2 1/4 (CATHETERS) ×1 IMPLANT
IV CATH 18G SAFETY (IV SOLUTION) ×2 IMPLANT
KIT SIGMOIDOSCOPE (SET/KITS/TRAYS/PACK) IMPLANT
KIT TURNOVER CYSTO (KITS) ×2 IMPLANT
MANIFOLD NEPTUNE II (INSTRUMENTS) ×1 IMPLANT
NEEDLE HYPO 22GX1.5 SAFETY (NEEDLE) ×2 IMPLANT
NS IRRIG 500ML POUR BTL (IV SOLUTION) ×2 IMPLANT
PACK BASIN DAY SURGERY FS (CUSTOM PROCEDURE TRAY) ×2 IMPLANT
PAD ARMBOARD 7.5X6 YLW CONV (MISCELLANEOUS) IMPLANT
PENCIL SMOKE EVACUATOR (MISCELLANEOUS) ×2 IMPLANT
RETRACTOR STAY HOOK 5MM (MISCELLANEOUS) ×1 IMPLANT
RETRACTOR STERILE 25.8CMX11.3 (INSTRUMENTS) ×1 IMPLANT
SPONGE SURGIFOAM ABS GEL 12-7 (HEMOSTASIS) IMPLANT
SUCTION FRAZIER HANDLE 10FR (MISCELLANEOUS) ×2
SUCTION TUBE FRAZIER 10FR DISP (MISCELLANEOUS) ×1 IMPLANT
SUT CHROMIC 2 0 SH (SUTURE) ×1 IMPLANT
SUT CHROMIC 3 0 SH 27 (SUTURE) ×1 IMPLANT
SUT SILK 2 0 (SUTURE) ×2
SUT SILK 2 0 SH CR/8 (SUTURE) ×1 IMPLANT
SUT SILK 2-0 18XBRD TIE 12 (SUTURE) ×1 IMPLANT
SUT SILK 3 0 SH 30 (SUTURE) IMPLANT
SUT SILK 3 0 SH CR/8 (SUTURE) IMPLANT
SUT VIC AB 2-0 SH 27 (SUTURE)
SUT VIC AB 2-0 SH 27XBRD (SUTURE) IMPLANT
SUT VIC AB 3-0 SH 27 (SUTURE)
SUT VIC AB 3-0 SH 27X BRD (SUTURE) IMPLANT
SUT VIC AB 3-0 SH 8-18 (SUTURE) ×2 IMPLANT
SUT VICRYL 3 0 UR 6 27 (SUTURE) IMPLANT
SYR 10ML LL (SYRINGE) ×2 IMPLANT
SYR BULB IRRIG 60ML STRL (SYRINGE) ×2 IMPLANT
SYR CONTROL 10ML LL (SYRINGE) ×2 IMPLANT
TOWEL OR 17X26 10 PK STRL BLUE (TOWEL DISPOSABLE) ×2 IMPLANT
TRAY DSU PREP LF (CUSTOM PROCEDURE TRAY) ×2 IMPLANT
TUBE CONNECTING 12X1/4 (SUCTIONS) ×2 IMPLANT
YANKAUER SUCT BULB TIP NO VENT (SUCTIONS) ×2 IMPLANT

## 2019-11-21 NOTE — Discharge Instructions (Addendum)
ANORECTAL SURGERY: POST OP INSTRUCTIONS 1. Take your usually prescribed home medications unless otherwise directed. 2. DIET: During the first few hours after surgery sip on some liquids until you are able to urinate.  It is normal to not urinate for several hours after this surgery.  If you feel uncomfortable, please contact the office for instructions.  After you are able to urinate,you may eat, if you feel like it.  Follow a light bland diet the first 24 hours after arrival home, such as soup, liquids, crackers, etc.  Be sure to include lots of fluids daily (6-8 glasses).  Avoid fast food or heavy meals, as your are more likely to get nauseated.  Eat a low fat diet the next few days after surgery.  Limit caffeine intake to 1-2 servings a day. 3. PAIN CONTROL: a. Pain is best controlled by a usual combination of several different methods TOGETHER: i. Muscle relaxation: Soak in a warm bath (or Sitz bath) three times a day and after bowel movements.  Continue to do this until all pain is resolved. ii. Over the counter pain medication iii. Prescription pain medication b. Most patients will experience some swelling and discomfort in the anus/rectal area and incisions.  Heat such as warm towels, sitz baths, warm baths, etc to help relax tight/sore spots and speed recovery.  Some people prefer to use ice, especially in the first couple days after surgery, as it may decrease the pain and swelling, or alternate between ice & heat.  Experiment to what works for you.  Swelling and bruising can take several weeks to resolve.  Pain can take even longer to completely resolve. c. It is helpful to take an over-the-counter pain medication regularly for the first few weeks.  Choose one of the following that works best for you: i. Naproxen (Aleve, etc)  Two 220mg tabs twice a day ii. Ibuprofen (Advil, etc) Three 200mg tabs four times a day (every meal & bedtime) d. A  prescription for pain medication (such as percocet,  oxycodone, hydrocodone, etc) should be given to you upon discharge.  Take your pain medication as prescribed.  i. If you are having problems/concerns with the prescription medicine (does not control pain, nausea, vomiting, rash, itching, etc), please call us [phone] to see if we need to switch you to a different pain medicine that will work better for you and/or control your side effect better. ii. If you need a refill on your pain medication, please contact your pharmacy.  They will contact our office to request authorization. Prescriptions will not be filled after 5 pm or on week-ends. 4. KEEP YOUR BOWELS REGULAR and AVOID CONSTIPATION a. The goal is one to two soft bowel movements a day.  You should at least have a bowel movement every other day. b. Avoid getting constipated.  Between the surgery and the pain medications, it is common to experience some constipation. This can be very painful after rectal surgery.  Increasing fluid intake and taking a fiber supplement (such as Metamucil, Citrucel, FiberCon, etc) 1-2 times a day regularly will usually help prevent this problem from occurring.  A stool softener like colace is also recommended.  This can be purchased over the counter at your pharmacy.  You can take it up to 3 times a day.  If you do not have a bowel movement after 24 hrs since your surgery, take one does of milk of magnesia.  If you still haven't had a bowel movement 8-12 hours after   that dose, take another dose.  If you don't have a bowel movement 48 hrs after surgery, purchase a Fleets enema from the drug store and administer gently per package instructions.  If you still are having trouble with your bowel movements after that, please call the office for further instructions. c. If you develop diarrhea or have many loose bowel movements, simplify your diet to bland foods & liquids for a few days.  Stop any stool softeners and decrease your fiber supplement.  Switching to mild  anti-diarrheal medications (Kayopectate, Pepto Bismol) can help.  If this worsens or does not improve, please call us.  5. Wound Care a. Remove your bandages before your first bowel movement or 8 hours after surgery.     b. Remove any wound packing material at this tim,e as well.  You do not need to repack the wound unless instructed otherwise.  Wear an absorbent pad or soft cotton gauze in your underwear to catch any drainage and help keep the area clean. You should change this every 2-3 hours while awake. c. Keep the area clean and dry.  Bathe / shower every day, especially after bowel movements.  Keep the area clean by showering / bathing over the incision / wound.   It is okay to soak an open wound to help wash it.  Wet wipes or showers / gentle washing after bowel movements is often less traumatic than regular toilet paper. d. You may have some styrofoam-like soft packing in the rectum which will come out with the first bowel movement.  e. You will often notice bleeding with bowel movements.  This should slow down by the end of the first week of surgery f. Expect some drainage.  This should slow down, too, by the end of the first week of surgery.  Wear an absorbent pad or soft cotton gauze in your underwear until the drainage stops. g. Do Not sit on a rubber or pillow ring.  This can make you symptoms worse.  You may sit on a soft pillow if needed.  6. ACTIVITIES as tolerated:   a. You may resume regular (light) daily activities beginning the next day--such as daily self-care, walking, climbing stairs--gradually increasing activities as tolerated.  If you can walk 30 minutes without difficulty, it is safe to try more intense activity such as jogging, treadmill, bicycling, low-impact aerobics, swimming, etc. b. Save the most intensive and strenuous activity for last such as sit-ups, heavy lifting, contact sports, etc  Refrain from any heavy lifting or straining until you are off narcotics for pain  control.   c. You may drive when you are no longer taking prescription pain medication, you can comfortably sit for long periods of time, and you can safely maneuver your car and apply brakes. d. You may have sexual intercourse when it is comfortable.  7. FOLLOW UP in our office a. Please call [facility] at [phone] to set up an appointment to see your surgeon in the office for a follow-up appointment approximately 3-4 weeks after your surgery. b. Make sure that you call for this appointment the day you arrive home to insure a convenient appointment time. 10. IF YOU HAVE DISABILITY OR FAMILY LEAVE FORMS, BRING THEM TO THE OFFICE FOR PROCESSING.  DO NOT GIVE THEM TO YOUR DOCTOR.     WHEN TO CALL US [phone]: 1. Poor pain control 2. Reactions / problems with new medications (rash/itching, nausea, etc)  3. Fever over 101.5 F (38.5 C) 4.   Inability to urinate 5. Nausea and/or vomiting 6. Worsening swelling or bruising 7. Continued bleeding from incision. 8. Increased pain, redness, or drainage from the incision  The clinic staff is available to answer your questions during regular business hours (8:30am-5pm).  Please don't hesitate to call and ask to speak to one of our nurses for clinical concerns.   A surgeon from [facility] is always on call at the hospitals   If you have a medical emergency, go to the nearest emergency room or call 911.    [facility] [address], [place], [place]  [address] ? MAIN: [phone] ? TOLL FREE: [phone] ? FAX [phone] [email or web address]   Do not take any Tylenol until after 1:00 pm today. Do not take any nonsteroidal anti inflammatories until after 3:300 pm today.     Post Anesthesia Home Care Instructions  Activity: Get plenty of rest for the remainder of the day. A responsible individual must stay with you for 24 hours following the procedure.  For the next 24  hours, DO NOT: -Drive a car -Advertising copywriter -Drink alcoholic beverages -Take any medication unless instructed by your physician -Make any legal decisions or sign important papers.  Meals: Start with liquid foods such as gelatin or soup. Progress to regular foods as tolerated. Avoid greasy, spicy, heavy foods. If nausea and/or vomiting occur, drink only clear liquids until the nausea and/or vomiting subsides. Call your physician if vomiting continues.  Special Instructions/Symptoms: Your throat may feel dry or sore from the anesthesia or the breathing tube placed in your throat during surgery. If this causes discomfort, gargle with warm salt water. The discomfort should disappear within 24 hours.  If you had a scopolamine patch placed behind your ear for the management of post- operative nausea and/or vomiting:  1. The medication in the patch is effective for 72 hours, after which it should be removed.  Wrap patch in a tissue and discard in the trash. Wash hands thoroughly with soap and water. 2. You may remove the patch earlier than 72 hours if you experience unpleasant side effects which may include dry mouth, dizziness or visual disturbances. 3. Avoid touching the patch. Wash your hands with soap and water after contact with the patch.    Information for Discharge Teaching: EXPAREL (bupivacaine liposome injectable suspension)   Your surgeon or anesthesiologist gave you EXPAREL(bupivacaine) to help control your pain after surgery.   EXPAREL is a local anesthetic that provides pain relief by numbing the tissue around the surgical site.  EXPAREL is designed to release pain medication over time and can control pain for up to 72 hours.  Depending on how you respond to EXPAREL, you may require less pain medication during your recovery.  Possible side effects:  Temporary loss of sensation or ability to move in the area where bupivacaine was injected.  Nausea, vomiting,  constipation  Rarely, numbness and tingling in your mouth or lips, lightheadedness, or anxiety may occur.  Call your doctor right away if you think you may be experiencing any of these sensations, or if you have other questions regarding possible side effects.  Follow all other discharge instructions given to you by your surgeon or nurse. Eat a healthy diet and drink plenty of water or other fluids.  If you return to the hospital for any reason within 96 hours following the administration of EXPAREL, it is important for health care providers to know that you have received this  anesthetic. A teal colored band has been placed on your arm with the date, time and amount of EXPAREL you have received in order to alert and inform your health care providers. Please leave this armband in place for the full 96 hours following administration, and then you may remove the band.

## 2019-11-21 NOTE — Transfer of Care (Signed)
Immediate Anesthesia Transfer of Care Note  Patient: Randall Trujillo  Procedure(s) Performed: Procedure(s) (LRB): LIGATION OF INTERNAL FISTULA TRACT (N/A)  Patient Location: PACU  Anesthesia Type: General  Level of Consciousness: awake, sedated, patient cooperative and responds to stimulation  Airway & Oxygen Therapy: Patient Spontanous Breathing and Patient connected to Kidron 02 and soft FM   Post-op Assessment: Report given to PACU RN, Post -op Vital signs reviewed and stable and Patient moving all extremities  Post vital signs: Reviewed and stable  Complications: No apparent anesthesia complications

## 2019-11-21 NOTE — Anesthesia Procedure Notes (Signed)
Procedure Name: Intubation Date/Time: 11/21/2019 8:38 AM Performed by: Justice Rocher, CRNA Pre-anesthesia Checklist: Patient identified, Emergency Drugs available, Suction available and Patient being monitored Patient Re-evaluated:Patient Re-evaluated prior to induction Oxygen Delivery Method: Circle system utilized Preoxygenation: Pre-oxygenation with 100% oxygen Induction Type: IV induction Ventilation: Mask ventilation without difficulty Laryngoscope Size: Mac and 4 Grade View: Grade II Tube type: Oral Tube size: 7.5 mm Number of attempts: 1 Airway Equipment and Method: Stylet and Oral airway Placement Confirmation: ETT inserted through vocal cords under direct vision,  positive ETCO2,  breath sounds checked- equal and bilateral and CO2 detector Secured at: 23 cm Tube secured with: Tape Dental Injury: Teeth and Oropharynx as per pre-operative assessment

## 2019-11-21 NOTE — Op Note (Signed)
11/21/2019  9:39 AM  PATIENT:  Randall Trujillo  44 y.o. male  Patient Care Team: Heywood Bene, MD as PCP - General (Unknown Physician Specialty)  PRE-OPERATIVE DIAGNOSIS:  ANAL FISTULA  POST-OPERATIVE DIAGNOSIS:  ANAL FISTULA  PROCEDURE:   LIGATION OF INTERNAL FISTULA TRACT   Surgeon(s): Romie Levee, MD  ASSISTANT: none   ANESTHESIA:   local and general  SPECIMEN:  No Specimen  DISPOSITION OF SPECIMEN:  N/A  COUNTS:  YES  PLAN OF CARE: Discharge to home after PACU  PATIENT DISPOSITION:  PACU - hemodynamically stable.  INDICATION: 44 y.o. M with trans-sphincteric R anterio-lateral fistula   OR FINDINGS: fistula as above, seton in place, no active infection  DESCRIPTION: the patient was identified in the preoperative holding area and taken to the OR where they were laid on the operating room table.  General anesthesia was induced without difficulty. The patient was then positioned in prone jackknife position with buttocks gently taped apart.  The patient was then prepped and draped in usual sterile fashion.  SCDs were noted to be in place prior to the initiation of anesthesia. A surgical timeout was performed indicating the correct patient, procedure, positioning and need for preoperative antibiotics.  A rectal block was performed using Marcaine with epinephrine mixed with experel.    I began with a digital rectal exam.  There were no masses.  Sphincter tone was good.  I then placed a Hill-Ferguson anoscope into the anal canal and evaluated this completely.  Seton in place in the distal anal canal.  I began by placing an S-shaped fistula probe through the fistula site.  The seton was removed.  A small incision was made over the intersphincteric groove overlying the internal portion of the seton.  A [place] retractor was placed for wound retraction.  I dissected down laterally upon either edge of the fistula tract using a hemostat.  I then dissected underneath of the  fistula tract using a blunt right angle.  When I was able to connect the 2 I placed 2-0 silk stay sutures on either side of the fistula tract and then transected in the middle using a 10 blade scalpel.  I then remove the stay sutures and placed a figure-of-eight silk suture on both openings of the internal and external fistula tract.  I tested with the fistula probe and the tract was closed.  I then injected hydrogen peroxide into the external opening and 2 small leaks were noted.  Additional 3-0 Vicryl sutures were placed over these leaks.  I then tested the fistula tract again with hydrogen peroxide and found no further leak.  An additional 3-0 Vicryl suture was also placed over the internal opening to close down the internal opening.  Once this was completed, the fistula tract was irrigated with normal saline.  The external opening was enlarged using electrocautery.  The internal portion of the wound was closed using interrupted 2-0 chromic sutures.  The skin was closed using interrupted 3-0 chromic sutures.  Lidocaine ointment and sterile dressing were applied.  The patient was then awakened from anesthesia and sent to the postanesthesia care unit in stable condition.  All counts were correct per operating room staff.

## 2019-11-21 NOTE — Interval H&P Note (Signed)
History and Physical Interval Note:  11/21/2019 8:26 AM  Randall Trujillo  has presented today for surgery, with the diagnosis of ANAL FISTULA.  The various methods of treatment have been discussed with the patient and family. After consideration of risks, benefits and other options for treatment, the patient has consented to  Procedure(s): LIGATION OF INTERNAL FISTULA TRACT (N/A) as a surgical intervention.  The patient's history has been reviewed, patient examined, no change in status, stable for surgery.  I have reviewed the patient's chart and labs.  Questions were answered to the patient's satisfaction.     Vanita Panda, MD  Colorectal and General Surgery [facility]

## 2019-11-21 NOTE — Anesthesia Postprocedure Evaluation (Signed)
Anesthesia Post Note  Patient: Randall Trujillo  Procedure(s) Performed: LIGATION OF INTERNAL FISTULA TRACT (N/A Rectum)     Patient location during evaluation: PACU Anesthesia Type: General Level of consciousness: awake and alert Pain management: pain level controlled Vital Signs Assessment: post-procedure vital signs reviewed and stable Respiratory status: spontaneous breathing, nonlabored ventilation, respiratory function stable and patient connected to nasal cannula oxygen Cardiovascular status: blood pressure returned to baseline and stable Postop Assessment: no apparent nausea or vomiting Anesthetic complications: no   No complications documented.  Last Vitals:  Vitals:   11/21/19 0956 11/21/19 1000  BP: 130/78 131/69  Pulse: 72 71  Resp: 19 (!) 21  Temp: (!) 36.3 C   SpO2: 100% 100%    Last Pain:  Vitals:   11/21/19 0718  TempSrc: Oral  PainSc: 0-No pain                 Trevor Iha

## 2019-11-22 ENCOUNTER — Encounter (HOSPITAL_BASED_OUTPATIENT_CLINIC_OR_DEPARTMENT_OTHER): Payer: Self-pay | Admitting: General Surgery

## 2020-02-18 ENCOUNTER — Other Ambulatory Visit: Payer: Self-pay | Admitting: Sports Medicine

## 2020-02-18 DIAGNOSIS — M779 Enthesopathy, unspecified: Secondary | ICD-10-CM

## 2020-02-22 ENCOUNTER — Other Ambulatory Visit: Payer: Self-pay | Admitting: Sports Medicine

## 2020-02-22 MED ORDER — MELOXICAM 15 MG PO TABS: 15.0000 mg | ORAL_TABLET | Freq: Every day | ORAL | 0 refills | Status: DC

## 2020-02-22 NOTE — Progress Notes (Signed)
Refilled mobic  ?

## 2020-02-24 NOTE — Telephone Encounter (Signed)
Please advise 

## 2020-03-05 ENCOUNTER — Ambulatory Visit: Payer: 59 | Admitting: Sports Medicine
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
## 2020-06-19 ENCOUNTER — Ambulatory Visit (INDEPENDENT_AMBULATORY_CARE_PROVIDER_SITE_OTHER): Payer: 59 | Admitting: Neurology

## 2020-06-19 ENCOUNTER — Encounter: Payer: Self-pay | Admitting: Neurology

## 2020-06-19 VITALS — BP 146/90 | HR 54 | Ht 73.0 in | Wt 210.0 lb

## 2020-06-19 DIAGNOSIS — G40209 Localization-related (focal) (partial) symptomatic epilepsy and epileptic syndromes with complex partial seizures, not intractable, without status epilepticus: Secondary | ICD-10-CM

## 2020-06-19 DIAGNOSIS — G40309 Generalized idiopathic epilepsy and epileptic syndromes, not intractable, without status epilepticus: Secondary | ICD-10-CM | POA: Diagnosis not present

## 2020-06-19 MED ORDER — DIVALPROEX SODIUM ER 500 MG PO TB24: 1500.0000 mg | ORAL_TABLET | Freq: Every day | ORAL | 4 refills | Status: DC

## 2020-06-19 NOTE — Patient Instructions (Signed)
Continue Depakote, don't miss any doses Check labs today  See you back in 1 year

## 2020-06-20 LAB — COMPREHENSIVE METABOLIC PANEL
ALT: 12 IU/L (ref 0–44)
AST: 11 IU/L (ref 0–40)
Albumin/Globulin Ratio: 1.9 (ref 1.2–2.2)
Albumin: 4.4 g/dL (ref 4.0–5.0)
Alkaline Phosphatase: 76 IU/L (ref 44–121)
BUN/Creatinine Ratio: 13 (ref 9–20)
BUN: 13 mg/dL (ref 6–24)
Bilirubin Total: <0.2 mg/dL (ref 0.0–1.2)
CO2: 22 mmol/L (ref 20–29)
Calcium: 9.5 mg/dL (ref 8.7–10.2)
Chloride: 104 mmol/L (ref 96–106)
Creatinine, Ser: 1 mg/dL (ref 0.76–1.27)
Globulin, Total: 2.3 g/dL (ref 1.5–4.5)
Glucose: 96 mg/dL (ref 65–99)
Potassium: 4.1 mmol/L (ref 3.5–5.2)
Sodium: 141 mmol/L (ref 134–144)
Total Protein: 6.7 g/dL (ref 6.0–8.5)
eGFR: 94 mL/min/{1.73_m2} (ref >59)

## 2020-06-20 LAB — CBC WITH DIFFERENTIAL/PLATELET
Basophils Absolute: 0.1 10*3/uL (ref 0.0–0.2)
Basos: 1 %
EOS (ABSOLUTE): 0.5 10*3/uL — ABNORMAL HIGH (ref 0.0–0.4)
Eos: 5 %
Hematocrit: 44.7 % (ref 37.5–51.0)
Hemoglobin: 14.8 g/dL (ref 13.0–17.7)
Immature Grans (Abs): 0 10*3/uL (ref 0.0–0.1)
Immature Granulocytes: 0 %
Lymphocytes Absolute: 2.7 10*3/uL (ref 0.7–3.1)
Lymphs: 30 %
MCH: 31.9 pg (ref 26.6–33.0)
MCHC: 33.1 g/dL (ref 31.5–35.7)
MCV: 96 fL (ref 79–97)
Monocytes Absolute: 0.6 10*3/uL (ref 0.1–0.9)
Monocytes: 7 %
Neutrophils Absolute: 5.2 10*3/uL (ref 1.4–7.0)
Neutrophils: 57 %
Platelets: 315 10*3/uL (ref 150–450)
RBC: 4.64 x10E6/uL (ref 4.14–5.80)
RDW: 13.1 % (ref 11.6–15.4)
WBC: 9.1 10*3/uL (ref 3.4–10.8)

## 2020-06-20 LAB — VALPROIC ACID LEVEL: Valproic Acid Lvl: 67 ug/mL (ref 50–100)

## 2020-06-24 ENCOUNTER — Telehealth: Payer: Self-pay

## 2020-06-24 NOTE — Telephone Encounter (Signed)
-----   Message from Glean Salvo, NP sent at 06/23/2020  9:08 AM EDT ----- Please call the patient, blood work is unremarkable.  Please call for any seizures.

## 2020-06-24 NOTE — Telephone Encounter (Signed)
Called pt, discussed lab results, pt verbalized understanding.

## 2021-04-07 ENCOUNTER — Telehealth: Payer: Self-pay | Admitting: Neurology

## 2021-04-07 NOTE — Telephone Encounter (Signed)
I called back - the patient and his girlfriend were together. They accepted the offered appt on 04/09/21.

## 2021-04-07 NOTE — Telephone Encounter (Signed)
We have room to increase Depakote, increase to 4 tablets at bedtime, can he come Thursday for early follow-up for labs and to discuss. Depakote level was 67 at last visit in May 2022.

## 2021-04-07 NOTE — Telephone Encounter (Signed)
Pt's girlfriend(not on DPR) is asking that pt be called , Randall Trujillo reported pt has had 4 seizures this week, 2 this morning.  They are taking place around 4 in the morning.  Randall Trujillo is asking pt be called as to what he should do and if there needs to be a change in medication.

## 2021-04-07 NOTE — Telephone Encounter (Signed)
I spoke to the patient's girlfriend on DPR.   Reports patient having seizures on the following dates: 2/17 around 4am, 2/19 around 4am, 2/22 around 74m and 7am.  All episodes presented in a similar manner. Blank stare, reaching out with arms, sweating, inability to verbally communicate, lasting about 30 seconds. He is able to talk with her immediately following the event but typically forgets the conversation. Returns to baseline within ten minutes.   Confirmed the patient is taking divalproex 500mg  24hr, three tabs QHS. Denies any missed doses. No other prescribed or OTC medications started. No recent infections. Getting a good night of sleep. The one main factor mentioned is heightened stress in his life right now.   She verbalized understanding that per [place] law there is no driving allowed until after being seizure free for six months. Stated he has not been driving.
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
## 2021-04-09 ENCOUNTER — Encounter: Payer: Self-pay | Admitting: Neurology

## 2021-04-09 ENCOUNTER — Ambulatory Visit: Payer: Managed Care, Other (non HMO) | Admitting: Neurology

## 2021-04-09 VITALS — BP 160/70 | HR 74

## 2021-04-09 DIAGNOSIS — G40309 Generalized idiopathic epilepsy and epileptic syndromes, not intractable, without status epilepticus: Secondary | ICD-10-CM | POA: Diagnosis not present

## 2021-04-09 MED ORDER — DIVALPROEX SODIUM ER 500 MG PO TB24: 2000.0000 mg | ORAL_TABLET | Freq: Every day | ORAL | 4 refills | Status: DC

## 2021-04-09 NOTE — Patient Instructions (Signed)
Increase Depakote to 2000 mg at bedtime Check labs today Check EEG No driving until seizure free 6 months See you back in 6 months

## 2021-04-10 LAB — COMPREHENSIVE METABOLIC PANEL
ALT: 10 IU/L (ref 0–44)
AST: 15 IU/L (ref 0–40)
Albumin/Globulin Ratio: 2.1 (ref 1.2–2.2)
Albumin: 4.8 g/dL (ref 4.0–5.0)
Alkaline Phosphatase: 69 IU/L (ref 44–121)
BUN/Creatinine Ratio: 15 (ref 9–20)
BUN: 15 mg/dL (ref 6–24)
Bilirubin Total: <0.2 mg/dL (ref 0.0–1.2)
CO2: 23 mmol/L (ref 20–29)
Calcium: 9.8 mg/dL (ref 8.7–10.2)
Chloride: 104 mmol/L (ref 96–106)
Creatinine, Ser: 1.03 mg/dL (ref 0.76–1.27)
Globulin, Total: 2.3 g/dL (ref 1.5–4.5)
Glucose: 89 mg/dL (ref 70–99)
Potassium: 4.4 mmol/L (ref 3.5–5.2)
Sodium: 142 mmol/L (ref 134–144)
Total Protein: 7.1 g/dL (ref 6.0–8.5)
eGFR: 90 mL/min/{1.73_m2} (ref >59)

## 2021-04-10 LAB — CBC WITH DIFFERENTIAL/PLATELET
Basophils Absolute: 0.1 10*3/uL (ref 0.0–0.2)
Basos: 1 %
EOS (ABSOLUTE): 0.1 10*3/uL (ref 0.0–0.4)
Eos: 1 %
Hematocrit: 42.4 % (ref 37.5–51.0)
Hemoglobin: 14.3 g/dL (ref 13.0–17.7)
Immature Grans (Abs): 0 10*3/uL (ref 0.0–0.1)
Immature Granulocytes: 0 %
Lymphocytes Absolute: 2.5 10*3/uL (ref 0.7–3.1)
Lymphs: 22 %
MCH: 32.1 pg (ref 26.6–33.0)
MCHC: 33.7 g/dL (ref 31.5–35.7)
MCV: 95 fL (ref 79–97)
Monocytes Absolute: 0.8 10*3/uL (ref 0.1–0.9)
Monocytes: 7 %
Neutrophils Absolute: 7.8 10*3/uL — ABNORMAL HIGH (ref 1.4–7.0)
Neutrophils: 69 %
Platelets: 322 10*3/uL (ref 150–450)
RBC: 4.45 x10E6/uL (ref 4.14–5.80)
RDW: 13.2 % (ref 11.6–15.4)
WBC: 11.2 10*3/uL — ABNORMAL HIGH (ref 3.4–10.8)

## 2021-04-10 LAB — TSH: TSH: 0.905 u[IU]/mL (ref 0.450–4.500)

## 2021-04-10 LAB — VALPROIC ACID LEVEL: Valproic Acid Lvl: 67 ug/mL (ref 50–100)

## 2021-04-13 ENCOUNTER — Telehealth: Payer: Self-pay | Admitting: *Deleted

## 2021-04-13 NOTE — Telephone Encounter (Signed)
Attempted to call pt, LVM for call back  °

## 2021-04-13 NOTE — Telephone Encounter (Signed)
-----   Message from Glean Salvo, NP sent at 04/13/2021  2:31 PM EST ----- Please call, labs show slightly elevated WBC 11.2, any sign of infection? he denied at our visit, UTI? URI? Otherwise labs are unremarkable, we increased the Depakote due to breakthrough seizures, this should be okay since level was 67. Hope the higher dose is doing okay. Please let me know if he has any questions or concerns!

## 2021-04-14 NOTE — Telephone Encounter (Signed)
I spoke to the patient. He denies any symptoms of infection (UTI or URI). He will continue the higher dose of divalproex. Pending EEG on 04/16/21 and follow up 10/15/21. Instructed to call our office to report any seizure activity.

## 2021-04-16 ENCOUNTER — Ambulatory Visit: Payer: Managed Care, Other (non HMO) | Admitting: Neurology

## 2021-04-16 DIAGNOSIS — G40309 Generalized idiopathic epilepsy and epileptic syndromes, not intractable, without status epilepticus: Secondary | ICD-10-CM

## 2021-04-22 ENCOUNTER — Telehealth: Payer: Self-pay | Admitting: *Deleted

## 2021-04-22 NOTE — Telephone Encounter (Signed)
Left patient a detailed message on voicemail (ok per DPR). Provided results, instructed to continue current meds and keep follow up appt. Gave our number to call back with any questions. ? ?  ?

## 2021-04-22 NOTE — Telephone Encounter (Signed)
-----   Message from Glean Salvo, NP sent at 04/22/2021 12:14 PM EST ----- ?Please call the patient, EEG is normal. ? ?CONCLUSION: ?This is a  normal awake EEG.  There is no electrodiagnostic evidence of epileptiform discharge. ?
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
## 2021-06-22 ENCOUNTER — Ambulatory Visit: Payer: 59 | Admitting: Neurology

## 2021-07-29 ENCOUNTER — Ambulatory Visit
Admission: RE | Admit: 2021-07-29 | Discharge: 2021-07-29 | Disposition: A | Discharge status: Home / Self Care | Payer: Self-pay | Source: Ambulatory Visit | Attending: Family Medicine | Admitting: Family Medicine

## 2021-07-29 ENCOUNTER — Other Ambulatory Visit: Payer: Self-pay | Admitting: Family Medicine

## 2021-07-29 DIAGNOSIS — M79671 Pain in right foot: Secondary | ICD-10-CM

## 2021-09-17 ENCOUNTER — Telehealth: Payer: Self-pay | Admitting: Neurology

## 2021-09-17 NOTE — Telephone Encounter (Signed)
LVM informing pt of need to reschedule 8/31 appointment due to Maralyn Sago being out. Per Maralyn Sago, pt can be scheduled on 8/28 if pt is available

## 2021-10-15 ENCOUNTER — Ambulatory Visit: Payer: Managed Care, Other (non HMO) | Admitting: Neurology
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
## 2021-10-29 ENCOUNTER — Ambulatory Visit: Payer: Commercial Managed Care - HMO | Admitting: Neurology

## 2021-10-29 ENCOUNTER — Encounter: Payer: Self-pay | Admitting: Neurology

## 2021-12-17 ENCOUNTER — Encounter: Payer: Self-pay | Admitting: Neurology

## 2021-12-17 ENCOUNTER — Ambulatory Visit: Payer: Commercial Managed Care - HMO | Admitting: Neurology

## 2021-12-17 VITALS — BP 157/106 | HR 85 | Ht 73.0 in | Wt 212.0 lb

## 2021-12-17 DIAGNOSIS — G40309 Generalized idiopathic epilepsy and epileptic syndromes, not intractable, without status epilepticus: Secondary | ICD-10-CM

## 2021-12-17 MED ORDER — LAMOTRIGINE ER 50 MG PO TB24: ORAL_TABLET | ORAL | 11 refills | Status: DC

## 2021-12-17 MED ORDER — DIVALPROEX SODIUM ER 500 MG PO TB24: 2000.0000 mg | ORAL_TABLET | Freq: Every day | ORAL | 4 refills | Status: DC

## 2021-12-17 NOTE — Patient Instructions (Signed)
Add on Lamictal XR 50 mg at bedtime for 1 week then take 100 mg at bedtime (2 pills), continue the Depakote at current dosing  Watch for rash with Lamictal, drink plenty of water,   Check Labs today   No driving until seizure free 6 months  Check EEG   See me back in 4 months
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
## 2021-12-18 LAB — CBC WITH DIFFERENTIAL/PLATELET
Basophils Absolute: 0.1 10*3/uL (ref 0.0–0.2)
Basos: 1 %
EOS (ABSOLUTE): 0.4 10*3/uL (ref 0.0–0.4)
Eos: 4 %
Hematocrit: 44.2 % (ref 37.5–51.0)
Hemoglobin: 15.1 g/dL (ref 13.0–17.7)
Immature Grans (Abs): 0 10*3/uL (ref 0.0–0.1)
Immature Granulocytes: 0 %
Lymphocytes Absolute: 3.2 10*3/uL — ABNORMAL HIGH (ref 0.7–3.1)
Lymphs: 35 %
MCH: 33.2 pg — ABNORMAL HIGH (ref 26.6–33.0)
MCHC: 34.2 g/dL (ref 31.5–35.7)
MCV: 97 fL (ref 79–97)
Monocytes Absolute: 0.9 10*3/uL (ref 0.1–0.9)
Monocytes: 9 %
Neutrophils Absolute: 4.8 10*3/uL (ref 1.4–7.0)
Neutrophils: 51 %
Platelets: 304 10*3/uL (ref 150–450)
RBC: 4.55 x10E6/uL (ref 4.14–5.80)
RDW: 12.8 % (ref 11.6–15.4)
WBC: 9.3 10*3/uL (ref 3.4–10.8)

## 2021-12-18 LAB — COMPREHENSIVE METABOLIC PANEL
ALT: 13 IU/L (ref 0–44)
AST: 11 IU/L (ref 0–40)
Albumin/Globulin Ratio: 2 (ref 1.2–2.2)
Albumin: 4.5 g/dL (ref 4.1–5.1)
Alkaline Phosphatase: 69 IU/L (ref 44–121)
BUN/Creatinine Ratio: 10 (ref 9–20)
BUN: 11 mg/dL (ref 6–24)
Bilirubin Total: <0.2 mg/dL (ref 0.0–1.2)
CO2: 25 mmol/L (ref 20–29)
Calcium: 9.4 mg/dL (ref 8.7–10.2)
Chloride: 106 mmol/L (ref 96–106)
Creatinine, Ser: 1.05 mg/dL (ref 0.76–1.27)
Globulin, Total: 2.2 g/dL (ref 1.5–4.5)
Glucose: 73 mg/dL (ref 70–99)
Potassium: 4.2 mmol/L (ref 3.5–5.2)
Sodium: 145 mmol/L — ABNORMAL HIGH (ref 134–144)
Total Protein: 6.7 g/dL (ref 6.0–8.5)
eGFR: 88 mL/min/{1.73_m2} (ref >59)

## 2021-12-18 LAB — VALPROIC ACID LEVEL: Valproic Acid Lvl: 68 ug/mL (ref 50–100)

## 2021-12-18 LAB — TSH: TSH: 1.24 u[IU]/mL (ref 0.450–4.500)

## 2021-12-18 NOTE — Progress Notes (Signed)
Chart reviewed, agree above plan ?

## 2021-12-21 ENCOUNTER — Telehealth: Payer: Self-pay

## 2021-12-21 NOTE — Telephone Encounter (Signed)
I left a detailed VM (as per DPR) to inform the patient of lab results. I included for the patient to watch for any rash and call for any further questions or concerns. A call back number was provided.

## 2021-12-21 NOTE — Telephone Encounter (Signed)
-----   Message from Suzzanne Cloud, NP sent at 12/18/2021 10:41 AM EDT ----- I called the patient, labs look fine, baseline level of Depakote was 68, TSH is normal. He will start Lamictal tonight, watch for any rash, call for any problems.

## 2021-12-30 ENCOUNTER — Other Ambulatory Visit: Payer: Commercial Managed Care - HMO | Admitting: *Deleted

## 2021-12-31 ENCOUNTER — Institutional Professional Consult (permissible substitution): Payer: Commercial Managed Care - HMO | Admitting: Neurology

## 2022-01-04 DIAGNOSIS — Z0289 Encounter for other administrative examinations: Secondary | ICD-10-CM

## 2022-01-27 ENCOUNTER — Other Ambulatory Visit: Payer: Self-pay

## 2022-01-27 MED ORDER — DIVALPROEX SODIUM ER 500 MG PO TB24: 2000.0000 mg | ORAL_TABLET | Freq: Every day | ORAL | 4 refills | Status: DC

## 2022-01-27 MED ORDER — LAMOTRIGINE ER 50 MG PO TB24: ORAL_TABLET | ORAL | 11 refills | Status: DC

## 2022-02-16 ENCOUNTER — Ambulatory Visit: Payer: Commercial Managed Care - HMO | Admitting: Neurology

## 2022-02-16 DIAGNOSIS — G40309 Generalized idiopathic epilepsy and epileptic syndromes, not intractable, without status epilepticus: Secondary | ICD-10-CM | POA: Diagnosis not present

## 2022-03-10 NOTE — Procedures (Signed)
   HISTORY: 47 year old male with history of complex partial seizure  TECHNIQUE:  This is a routine 16 channel EEG recording with one channel devoted to a limited EKG recording.  It was performed during wakefulness, drowsiness and asleep.  Hyperventilation and photic stimulation were performed as activating procedures.  There are minimum muscle and movement artifact noted.  Upon maximum arousal, posterior dominant waking rhythm consistent of rhythmic alpha range activity. Activities are symmetric over the bilateral posterior derivations and attenuated with eye opening.  Hyperventilation produced mild/moderate buildup with higher amplitude and the slower activities noted.  Photic stimulation did not alter the tracing.  During EEG recording, patient developed drowsiness and no deeper stage of sleep was achieved During EEG recording, there was no epileptiform discharge noted.  EKG demonstrate normal sinus rhythm.  CONCLUSION: This is a  normal awake EEG.  There is no electrodiagnostic evidence of epileptiform discharge.  Marcial Pacas, M.D. Ph.D.  [facility] [place], [place] [address] Phone: [phone] Fax:      [phone]

## 2022-03-11 ENCOUNTER — Telehealth: Payer: Self-pay

## 2022-03-11 ENCOUNTER — Telehealth: Payer: Self-pay | Admitting: Neurology

## 2022-03-11 NOTE — Telephone Encounter (Signed)
-----  Message from Suzzanne Cloud, NP sent at 03/10/2022  3:05 PM EST ----- Jeneen Rinks, EEG was normal.  How are you doing with the medication adjustment?  Thanks, Judson Roch

## 2022-03-11 NOTE — Telephone Encounter (Signed)
Tried to return call and it said call cannot be completed, unable to leave vm. Will try again at a later time

## 2022-03-11 NOTE — Telephone Encounter (Signed)
Pt verified by name and DOB,  normal results given per provider, pt voiced understanding all question answered.  Pt states he is doing well on medications.

## 2022-03-11 NOTE — Telephone Encounter (Signed)
Pt called back wanting the RN to call him back with the EEG results.

## 2022-04-21 ENCOUNTER — Telehealth: Payer: Self-pay | Admitting: Neurology

## 2022-04-21 MED ORDER — DIVALPROEX SODIUM ER 500 MG PO TB24: 2000.0000 mg | ORAL_TABLET | Freq: Every day | ORAL | 4 refills | Status: DC

## 2022-04-21 NOTE — Telephone Encounter (Signed)
Rx sent to pharmacy   

## 2022-04-21 NOTE — Telephone Encounter (Signed)
Pt states he is down to 1 pill for his divalproex (DEPAKOTE ER) 500 MG 24 hr tablet, to [place] . Pt is going out of town tomorrow for 4 days and is asking this be called in for him today as quickly as possible.

## 2022-04-29 ENCOUNTER — Ambulatory Visit: Payer: Commercial Managed Care - HMO | Admitting: Neurology

## 2022-04-29 ENCOUNTER — Encounter: Payer: Self-pay | Admitting: Neurology

## 2022-04-29 VITALS — BP 135/91 | HR 71 | Ht 73.0 in | Wt 212.0 lb

## 2022-04-29 DIAGNOSIS — G40309 Generalized idiopathic epilepsy and epileptic syndromes, not intractable, without status epilepticus: Secondary | ICD-10-CM

## 2022-04-29 MED ORDER — LAMOTRIGINE ER 50 MG PO TB24: ORAL_TABLET | ORAL | 3 refills | Status: DC

## 2022-04-29 MED ORDER — DIVALPROEX SODIUM ER 500 MG PO TB24: 2000.0000 mg | ORAL_TABLET | Freq: Every day | ORAL | 4 refills | Status: DC
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
## 2022-04-29 NOTE — Patient Instructions (Signed)
Stay on same medications Check labs today  [place] law no driving until seizure free 6 months

## 2022-04-30 LAB — LAMOTRIGINE LEVEL: Lamotrigine Lvl: 3 ug/mL (ref 2.0–20.0)

## 2022-04-30 LAB — VALPROIC ACID LEVEL: Valproic Acid Lvl: 59 ug/mL (ref 50–100)

## 2022-05-04 ENCOUNTER — Telehealth: Payer: Self-pay

## 2022-05-04 NOTE — Telephone Encounter (Signed)
-----   Message from Suzzanne Cloud, NP sent at 05/03/2022 12:51 PM EDT ----- Labs look fine. Continue current medications.

## 2022-05-04 NOTE — Telephone Encounter (Signed)
Pt returned phone call. Would like a call from the nurse. 

## 2022-05-04 NOTE — Telephone Encounter (Signed)
I called patient. I advised him of his lab results. Pt verbalized understanding of results. Pt had no questions at this time but was encouraged to call back if questions arise.

## 2022-05-04 NOTE — Telephone Encounter (Signed)
Patient called.  Left message for patient to call back.

## 2022-05-05 ENCOUNTER — Telehealth: Payer: Self-pay | Admitting: Neurology

## 2022-05-05 NOTE — Telephone Encounter (Signed)
Mikala from [facility] called needing to clarify instructions on the pts lamoTRIgine (LAMICTAL XR) 50 MG 24 hour tablet  Please advise.

## 2022-05-05 NOTE — Telephone Encounter (Signed)
Called pharmacy back. Spoke w/ Mikayla. She wanted to know if medication needed to be titrated. Advised per SS,NP last note 04/29/22, he has been on this and no need for titration. She verbalized understanding.

## 2022-09-14 ENCOUNTER — Encounter (HOSPITAL_COMMUNITY): Payer: Self-pay

## 2022-09-14 ENCOUNTER — Emergency Department (HOSPITAL_COMMUNITY)
Admission: EM | Admit: 2022-09-14 | Discharge: 2022-09-15 | Disposition: A | Discharge status: Home / Self Care | Payer: Commercial Managed Care - HMO | Attending: Emergency Medicine | Admitting: Emergency Medicine

## 2022-09-14 ENCOUNTER — Emergency Department (HOSPITAL_COMMUNITY): Payer: Commercial Managed Care - HMO

## 2022-09-14 ENCOUNTER — Other Ambulatory Visit: Payer: Self-pay

## 2022-09-14 DIAGNOSIS — Y92410 Unspecified street and highway as the place of occurrence of the external cause: Secondary | ICD-10-CM | POA: Insufficient documentation

## 2022-09-14 DIAGNOSIS — S20212A Contusion of left front wall of thorax, initial encounter: Secondary | ICD-10-CM | POA: Diagnosis not present

## 2022-09-14 DIAGNOSIS — R030 Elevated blood-pressure reading, without diagnosis of hypertension: Secondary | ICD-10-CM | POA: Insufficient documentation

## 2022-09-14 DIAGNOSIS — R0781 Pleurodynia: Secondary | ICD-10-CM | POA: Diagnosis present

## 2022-09-14 LAB — CBC WITH DIFFERENTIAL/PLATELET
Abs Immature Granulocytes: 0.04 10*3/uL (ref 0.00–0.07)
Basophils Absolute: 0 10*3/uL (ref 0.0–0.1)
Basophils Relative: 0 %
Eosinophils Absolute: 0.3 10*3/uL (ref 0.0–0.5)
Eosinophils Relative: 3 %
HCT: 43.9 % (ref 39.0–52.0)
Hemoglobin: 14.7 g/dL (ref 13.0–17.0)
Immature Granulocytes: 0 %
Lymphocytes Relative: 17 %
Lymphs Abs: 1.8 10*3/uL (ref 0.7–4.0)
MCH: 32.5 pg (ref 26.0–34.0)
MCHC: 33.5 g/dL (ref 30.0–36.0)
MCV: 96.9 fL (ref 80.0–100.0)
Monocytes Absolute: 1 10*3/uL (ref 0.1–1.0)
Monocytes Relative: 10 %
Neutro Abs: 7.3 10*3/uL (ref 1.7–7.7)
Neutrophils Relative %: 70 %
Platelets: 329 10*3/uL (ref 150–400)
RBC: 4.53 MIL/uL (ref 4.22–5.81)
RDW: 13.8 % (ref 11.5–15.5)
WBC: 10.5 10*3/uL (ref 4.0–10.5)
nRBC: 0 % (ref 0.0–0.2)

## 2022-09-14 LAB — COMPREHENSIVE METABOLIC PANEL
ALT: 25 U/L (ref 0–44)
AST: 26 U/L (ref 15–41)
Albumin: 3.8 g/dL (ref 3.5–5.0)
Alkaline Phosphatase: 55 U/L (ref 38–126)
Anion gap: 7 (ref 5–15)
BUN: 9 mg/dL (ref 6–20)
CO2: 24 mmol/L (ref 22–32)
Calcium: 9 mg/dL (ref 8.9–10.3)
Chloride: 106 mmol/L (ref 98–111)
Creatinine, Ser: 1.16 mg/dL (ref 0.61–1.24)
GFR, Estimated: >60 mL/min (ref >60)
Glucose, Bld: 100 mg/dL — ABNORMAL HIGH (ref 70–99)
Potassium: 4.1 mmol/L (ref 3.5–5.1)
Sodium: 137 mmol/L (ref 135–145)
Total Bilirubin: 0.9 mg/dL (ref 0.3–1.2)
Total Protein: 6.6 g/dL (ref 6.5–8.1)

## 2022-09-14 LAB — I-STAT CHEM 8, ED
BUN: 11 mg/dL (ref 6–20)
Calcium, Ion: 1.13 mmol/L — ABNORMAL LOW (ref 1.15–1.40)
Chloride: 105 mmol/L (ref 98–111)
Creatinine, Ser: 1.2 mg/dL (ref 0.61–1.24)
Glucose, Bld: 97 mg/dL (ref 70–99)
HCT: 46 % (ref 39.0–52.0)
Hemoglobin: 15.6 g/dL (ref 13.0–17.0)
Potassium: 4.1 mmol/L (ref 3.5–5.1)
Sodium: 140 mmol/L (ref 135–145)
TCO2: 26 mmol/L (ref 22–32)

## 2022-09-14 MED ORDER — NAPROXEN 250 MG PO TABS
500.0000 mg | ORAL_TABLET | Freq: Once | ORAL | Status: AC
  Administered 2022-09-15: 500 mg via ORAL
  Filled 2022-09-14: qty 2

## 2022-09-14 MED ORDER — IOHEXOL 350 MG/ML SOLN
75.0000 mL | Freq: Once | INTRAVENOUS | Status: AC | PRN
  Administered 2022-09-14: 75 mL via INTRAVENOUS

## 2022-09-14 MED ORDER — OXYCODONE-ACETAMINOPHEN 5-325 MG PO TABS
1.0000 | ORAL_TABLET | Freq: Once | ORAL | Status: AC
  Administered 2022-09-15: 1 via ORAL
  Filled 2022-09-14: qty 1

## 2022-09-14 MED ORDER — OXYCODONE HCL 5 MG PO TABS: 5.0000 mg | ORAL_TABLET | ORAL | 0 refills | Status: AC | PRN
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
## 2022-10-19 ENCOUNTER — Telehealth: Payer: Self-pay | Admitting: Neurology

## 2022-10-19 NOTE — Telephone Encounter (Signed)
Called and spoke to patient who states he was seen in [place] ED today for a seizure and was told to follow up with [place]. I will ask debra for records, but patient is reporting he has had 3-4 seizures in the last week and a half, he denies missing any doses in medication and we confirm he is taking as prescribed. They are happening early in the morning like 5-6 am and lasting less than a minute, he had begun to have them during the day so he went to ER. The one he had today made him have to sit down. He states no changes to medications were made at the ER and he was told to follow up here.

## 2022-10-19 NOTE — Telephone Encounter (Signed)
Message being forwarded to POD 2 on this request from Pt asking if he can be seen earlier due to recent seizure activity and there being an issue with his medications, please call

## 2022-10-20 ENCOUNTER — Telehealth: Payer: Self-pay
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
## 2022-10-20 NOTE — Telephone Encounter (Signed)
Call to patient to offer 10/21/22 2:45 appt with sarah. Spot on hold. Left message to return call

## 2022-10-20 NOTE — Telephone Encounter (Signed)
Patient scheduled 10/21/22 2:45pm

## 2022-10-21 ENCOUNTER — Encounter: Payer: Self-pay | Admitting: Neurology

## 2022-10-21 ENCOUNTER — Ambulatory Visit: Payer: Commercial Managed Care - HMO | Admitting: Neurology

## 2022-10-21 VITALS — BP 143/93 | HR 97 | Ht 74.0 in | Wt 203.2 lb

## 2022-10-21 DIAGNOSIS — G40309 Generalized idiopathic epilepsy and epileptic syndromes, not intractable, without status epilepticus: Secondary | ICD-10-CM

## 2022-10-21 MED ORDER — LAMOTRIGINE ER 50 MG PO TB24: ORAL_TABLET | ORAL | 3 refills | Status: DC

## 2022-10-21 MED ORDER — DIVALPROEX SODIUM ER 500 MG PO TB24: 2000.0000 mg | ORAL_TABLET | Freq: Every day | ORAL | 4 refills | Status: DC

## 2022-10-21 NOTE — Patient Instructions (Addendum)
Increase Lamictal XR 50 mg to 3 tablets at bedtime, continue Depakote ER 500 mg 4 tablets at bedtime   Check labs today   No driving until seizure free 6 months   Recommend against drug use   Call for seizures, see you back in 6 months   Please get a primary care doctor, you need a physical   Meds ordered this encounter  Medications   lamoTRIgine (LAMICTAL XR) 50 MG 24 hour tablet    Sig: Take 3 at bedtime    Dispense:  180 tablet    Refill:  3   divalproex (DEPAKOTE ER) 500 MG 24 hr tablet    Sig: Take 4 tablets (2,000 mg total) by mouth at bedtime.    Dispense:  360 tablet    Refill:  4

## 2022-10-22 LAB — CBC WITH DIFFERENTIAL/PLATELET
Basophils Absolute: 0.1 10*3/uL (ref 0.0–0.2)
Basos: 1 %
EOS (ABSOLUTE): 0.1 10*3/uL (ref 0.0–0.4)
Eos: 1 %
Hematocrit: 44.3 % (ref 37.5–51.0)
Hemoglobin: 15.2 g/dL (ref 13.0–17.7)
Immature Grans (Abs): 0 10*3/uL (ref 0.0–0.1)
Immature Granulocytes: 0 %
Lymphocytes Absolute: 2.6 10*3/uL (ref 0.7–3.1)
Lymphs: 30 %
MCH: 34.1 pg — ABNORMAL HIGH (ref 26.6–33.0)
MCHC: 34.3 g/dL (ref 31.5–35.7)
MCV: 99 fL — ABNORMAL HIGH (ref 79–97)
Monocytes Absolute: 0.7 10*3/uL (ref 0.1–0.9)
Monocytes: 8 %
Neutrophils Absolute: 5.2 10*3/uL (ref 1.4–7.0)
Neutrophils: 60 %
Platelets: 332 10*3/uL (ref 150–450)
RBC: 4.46 x10E6/uL (ref 4.14–5.80)
RDW: 12.9 % (ref 11.6–15.4)
WBC: 8.6 10*3/uL (ref 3.4–10.8)

## 2022-10-22 LAB — COMPREHENSIVE METABOLIC PANEL
ALT: 25 IU/L (ref 0–44)
AST: 18 IU/L (ref 0–40)
Albumin: 4.7 g/dL (ref 4.1–5.1)
Alkaline Phosphatase: 89 IU/L (ref 44–121)
BUN/Creatinine Ratio: 10 (ref 9–20)
BUN: 11 mg/dL (ref 6–24)
Bilirubin Total: 0.2 mg/dL (ref 0.0–1.2)
CO2: 23 mmol/L (ref 20–29)
Calcium: 10 mg/dL (ref 8.7–10.2)
Chloride: 104 mmol/L (ref 96–106)
Creatinine, Ser: 1.15 mg/dL (ref 0.76–1.27)
Globulin, Total: 2.6 g/dL (ref 1.5–4.5)
Glucose: 106 mg/dL — ABNORMAL HIGH (ref 70–99)
Potassium: 3.9 mmol/L (ref 3.5–5.2)
Sodium: 143 mmol/L (ref 134–144)
Total Protein: 7.3 g/dL (ref 6.0–8.5)
eGFR: 79 mL/min/{1.73_m2} (ref >59)

## 2022-10-22 LAB — VALPROIC ACID LEVEL: Valproic Acid Lvl: 77 ug/mL (ref 50–100)

## 2022-10-22 LAB — LAMOTRIGINE LEVEL: Lamotrigine Lvl: 4 ug/mL (ref 2.0–20.0)

## 2022-10-25 ENCOUNTER — Telehealth: Payer: Self-pay

## 2022-10-25 NOTE — Telephone Encounter (Signed)
-----   Message from Glean Salvo sent at 10/25/2022  3:27 PM EDT ----- Please let the patient know, blood work is unremarkable, please continue with dose increase of Lamictal. Let me know if seizures continue, will do EEG. Thanks

## 2022-10-25 NOTE — Telephone Encounter (Signed)
Call to patient, line busy.  

## 2022-10-26 ENCOUNTER — Telehealth: Payer: Self-pay

## 2022-10-26 NOTE — Telephone Encounter (Signed)
1st attempt unable to leave msg  

## 2022-10-26 NOTE — Telephone Encounter (Signed)
-----   Message from Glean Salvo sent at 10/25/2022  3:27 PM EDT ----- Please let the patient know, blood work is unremarkable, please continue with dose increase of Lamictal. Let me know if seizures continue, will do EEG. Thanks

## 2022-10-27 NOTE — Telephone Encounter (Signed)
Unable to reach pt

## 2022-11-10 ENCOUNTER — Ambulatory Visit: Payer: Commercial Managed Care - HMO | Admitting: Neurology

## 2022-11-30 DIAGNOSIS — Z0289 Encounter for other administrative examinations: Secondary | ICD-10-CM

## 2022-12-01 ENCOUNTER — Telehealth: Payer: Self-pay | Admitting: *Deleted

## 2022-12-01 NOTE — Telephone Encounter (Signed)
Pt DMV form faxed on 12/01/2022

## 2023-04-05 ENCOUNTER — Telehealth: Payer: Self-pay | Admitting: Neurology

## 2023-04-05 NOTE — Telephone Encounter (Signed)
 Pt said notified by [facility] have not received the form faxed in October 2024. Requesting also a list of medications. Would like a call from the nurse.

## 2023-04-06 NOTE — Telephone Encounter (Signed)
 Call to patient, no answer. Left voicemail that we would follow up once returned to the office.

## 2023-04-19 ENCOUNTER — Telehealth: Payer: Self-pay | Admitting: Neurology

## 2023-04-19 MED ORDER — DIVALPROEX SODIUM ER 500 MG PO TB24: 2000.0000 mg | ORAL_TABLET | Freq: Every day | ORAL | 4 refills | Status: DC

## 2023-04-19 NOTE — Telephone Encounter (Signed)
 Refill on Depakote ER 500 mg, has been sent to pt's pharmacy, [facility].

## 2023-04-19 NOTE — Telephone Encounter (Signed)
 Pt is requesting a refill for divalproex (DEPAKOTE ER) 500 MG 24 hr tablet .  Pharmacy: [facility] [phone]

## 2023-04-25 ENCOUNTER — Ambulatory Visit: Payer: Commercial Managed Care - HMO | Admitting: Neurology

## 2023-04-25 ENCOUNTER — Encounter: Payer: Self-pay | Admitting: Neurology

## 2023-06-20 ENCOUNTER — Other Ambulatory Visit: Payer: Self-pay | Admitting: Neurology

## 2023-08-19 ENCOUNTER — Other Ambulatory Visit: Payer: Self-pay | Admitting: Neurology

## 2023-08-31 ENCOUNTER — Encounter: Payer: Self-pay | Admitting: Pediatrics

## 2023-09-22 ENCOUNTER — Telehealth: Payer: Self-pay | Admitting: *Deleted

## 2023-09-22 ENCOUNTER — Ambulatory Visit (AMBULATORY_SURGERY_CENTER): Admitting: *Deleted

## 2023-09-22 VITALS — Ht 74.0 in | Wt 215.0 lb

## 2023-09-22 DIAGNOSIS — Z1211 Encounter for screening for malignant neoplasm of colon: Secondary | ICD-10-CM

## 2023-09-22 MED ORDER — NA SULFATE-K SULFATE-MG SULF 17.5-3.13-1.6 GM/177ML PO SOLN
1.0000 | Freq: Once | ORAL | 0 refills | Status: AC
Start: 2023-09-22 — End: 2023-09-22

## 2023-09-22 NOTE — Telephone Encounter (Signed)
 Attempt to reach pt for pre-visit. LM with call back #.  Will attempt to reach again in 5 min due to no other # listed in profile  Second attempt to reach pt

## 2023-09-22 NOTE — Progress Notes (Signed)
,  Pt's name and DOB verified at the beginning of the pre-visit with 2 identifiers  Pt denies any difficulty with ambulating,sitting, laying down or rolling side to side   Pt uses ambulation assistance device or has issues with mobiiity  Pt has no issues moving head neck or swallowing  No egg or soy allergy known to patient   No issues known to pt with past sedation with any surgeries or procedures  Patient denies ever being intubated  No FH of Malignant Hyperthermia  Pt is not on home 02   Pt is not on blood thinners   Pt denies issues with constipation   Pt is not on dialysis  Pt denise any abnormal heart rhythms   Pt denies any upcoming cardiac testing   Chart not reviewed by CRNA prior to PV Pt's last Epileptic seizure was 09/21/23. TE to be sent to MD and [facility] to clarify if needs hosptial or can be done at [facility] Pt informed that this will happen and if it is deemed safer to have done at hospital that he will get a call notifying him of changes as well new instructions for the hospital. Pt states he understands.  Visit by phone  Pt states weight is 215 lb  IInstructions reviewed. Pt given  both [facility] main # and MD on call # prior to instructions. Instructions reviewed. Pt given  both [facility] main # and MD on call # prior to instructions.   Informed pt to come in at the time discussed and is shown on PV instructions. Pt informed that they are coming in i.e. cloths change.,IV placement , Consent signing and meeting CRNA. Pt states understanding after  given opportunity to ask questions t after all  instructions given. Instructed pt to review instructions again prior to procedure and call main # given if has any questions.. Pt states they will.  Instructed pt where to find PV instructions in My Chart

## 2023-09-23 ENCOUNTER — Other Ambulatory Visit: Payer: Self-pay

## 2023-09-23 ENCOUNTER — Encounter: Payer: Self-pay | Admitting: Pediatrics

## 2023-09-23 DIAGNOSIS — Z1211 Encounter for screening for malignant neoplasm of colon: Secondary | ICD-10-CM

## 2023-09-23 NOTE — Telephone Encounter (Signed)
 Attempt to reach pt to make aware of new date, location and times. LM with call back #. New set of instructions to be sent with new info to be mailed to address verified in PV on 07/23/23.

## 2023-09-23 NOTE — Telephone Encounter (Signed)
 Screening colonoscopy has been scheduled at [facility] on 11/17/23 arriving at 7:45 am for a 9:15 am case start time.

## 2023-09-23 NOTE — Telephone Encounter (Signed)
 ID with 2 Identifiers  Informed pt that he has been scheduled for [facility] on 11/17/23 and to come in at 7:45 for his procedure at 9:15 am. Reviewed some of the changes from previous instructions with pt. Pt states he has main office # for any questions and RN informed him that a new set of instructions have been sent to his verified address and to call if he does not receive them by middle of next week. Pt states he will. No other questions at this time.
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
## 2023-10-05 ENCOUNTER — Telehealth: Payer: Self-pay | Admitting: Pediatrics

## 2023-10-05 NOTE — Telephone Encounter (Signed)
 Good Afternoon Dr.McGreal  Patient called to verify procedure date since on his MyChart it showed he had 2 procedure dates, one was for tomorrow 8/21 and the other was for 11/17/23 at [facility]. Patient was confused since he already had spoken to nurse in regards to new procedure date. After viewing patients chart looks like he needed to have the colonoscopy at the hospital and not at the [facility]. Went ahead to cancel appointment so he is now only scheduled for the [facility] procedure date on 10/2.   Please advise  Thank you

## 2023-10-06 ENCOUNTER — Encounter: Admitting: Pediatrics

## 2023-10-25 ENCOUNTER — Other Ambulatory Visit: Payer: Self-pay | Admitting: Neurology

## 2023-11-11 ENCOUNTER — Encounter (HOSPITAL_COMMUNITY): Payer: Self-pay | Admitting: Pediatrics

## 2023-11-11 NOTE — Progress Notes (Signed)
 Attempted to obtain medical history for pre op call via telephone, unable to reach at this time. HIPAA compliant voicemail message left requesting return call to pre surgical testing department.

## 2023-11-15 ENCOUNTER — Telehealth: Payer: Self-pay

## 2023-11-15 NOTE — Telephone Encounter (Signed)
 Procedure:COLON Procedure date: 11/17/23 Procedure location: [facility] Arrival Time: 7:45 Spoke with the patient Y/N: N Any prep concerns? N  Has the patient obtained the prep from the pharmacy ? N Do you have a care partner and transportation:N Any additional concerns? N   I called patient on 3 different occasions  and I left a detail message about the procedure and the office number

## 2023-11-16 NOTE — Telephone Encounter (Signed)
 Spoke with pt. Pt stated that he is good to go for his procedure.  Pt has prep and has no further questions.  Pt verbalized understanding with all questions answered.

## 2023-11-17 ENCOUNTER — Ambulatory Visit (HOSPITAL_COMMUNITY): Admitting: Anesthesiology

## 2023-11-17 ENCOUNTER — Ambulatory Visit (HOSPITAL_COMMUNITY)
Admission: RE | Admit: 2023-11-17 | Discharge: 2023-11-17 | Disposition: A | Discharge status: Home / Self Care | Attending: Pediatrics | Admitting: Pediatrics

## 2023-11-17 ENCOUNTER — Encounter (HOSPITAL_COMMUNITY)
Admission: RE | Disposition: A | Discharge status: Home / Self Care | Payer: Self-pay | Source: Home / Self Care | Attending: Pediatrics

## 2023-11-17 ENCOUNTER — Other Ambulatory Visit: Payer: Self-pay

## 2023-11-17 ENCOUNTER — Encounter (HOSPITAL_COMMUNITY): Payer: Self-pay | Admitting: Pediatrics

## 2023-11-17 DIAGNOSIS — D125 Benign neoplasm of sigmoid colon: Secondary | ICD-10-CM | POA: Diagnosis not present

## 2023-11-17 DIAGNOSIS — D128 Benign neoplasm of rectum: Secondary | ICD-10-CM

## 2023-11-17 DIAGNOSIS — K635 Polyp of colon: Secondary | ICD-10-CM

## 2023-11-17 DIAGNOSIS — Z800 Family history of malignant neoplasm of digestive organs: Secondary | ICD-10-CM | POA: Diagnosis not present

## 2023-11-17 DIAGNOSIS — R569 Unspecified convulsions: Secondary | ICD-10-CM | POA: Diagnosis not present

## 2023-11-17 DIAGNOSIS — Z1211 Encounter for screening for malignant neoplasm of colon: Secondary | ICD-10-CM

## 2023-11-17 DIAGNOSIS — D127 Benign neoplasm of rectosigmoid junction: Secondary | ICD-10-CM | POA: Insufficient documentation

## 2023-11-17 DIAGNOSIS — D123 Benign neoplasm of transverse colon: Secondary | ICD-10-CM | POA: Insufficient documentation

## 2023-11-17 HISTORY — PX: COLONOSCOPY: SHX5424

## 2023-11-17 SURGERY — COLONOSCOPY
Anesthesia: Monitor Anesthesia Care

## 2023-11-17 MED ORDER — PROPOFOL 1000 MG/100ML IV EMUL
INTRAVENOUS | Status: AC
  Filled 2023-11-17: qty 100

## 2023-11-17 MED ORDER — PROPOFOL 10 MG/ML IV BOLUS
INTRAVENOUS | Status: DC | PRN
  Administered 2023-11-17 (×2): 20 mg via INTRAVENOUS
  Administered 2023-11-17: 10 mg via INTRAVENOUS

## 2023-11-17 MED ORDER — SODIUM CHLORIDE 0.9 % IV SOLN: INTRAVENOUS | Status: DC

## 2023-11-17 MED ORDER — LIDOCAINE HCL 1 % IJ SOLN
INTRAMUSCULAR | Status: DC | PRN
  Administered 2023-11-17: 40 mg via INTRADERMAL

## 2023-11-17 MED ORDER — PROPOFOL 500 MG/50ML IV EMUL
INTRAVENOUS | Status: DC | PRN
  Administered 2023-11-17: 150 ug/kg/min via INTRAVENOUS

## 2023-11-17 MED ORDER — LACTATED RINGERS IV SOLN
INTRAVENOUS | Status: AC | PRN
  Administered 2023-11-17: 20 mL/h via INTRAVENOUS

## 2023-11-17 NOTE — Transfer of Care (Signed)
 Immediate Anesthesia Transfer of Care Note  Patient: Randall Trujillo  Procedure(s) Performed: COLONOSCOPY  Patient Location: PACU and Endoscopy Unit  Anesthesia Type:MAC  Level of Consciousness: awake, alert , oriented, and patient cooperative  Airway & Oxygen Therapy: Patient Spontanous Breathing and Patient connected to face mask oxygen  Post-op Assessment: Report given to RN and Post -op Vital signs reviewed and stable  Post vital signs: Reviewed and stable  Last Vitals:  Vitals Value Taken Time  BP 127/82 11/17/23 10:40  Temp 36.4 C 11/17/23 10:32  Pulse 52 11/17/23 10:46  Resp 10 11/17/23 10:46  SpO2 100 % 11/17/23 10:46  Vitals shown include unfiled device data.  Last Pain:  Vitals:   11/17/23 1040  TempSrc:   PainSc: 0-No pain         Complications: No notable events documented.

## 2023-11-17 NOTE — Op Note (Signed)
 [facility] Patient Name: Randall Trujillo Procedure Date: 11/17/2023 MRN: [id number] Attending MD: Inocente Hausen , MD, [id number] Date of Birth: 06/22/1975 CSN: [id number] Age: 48 Admit Type: Outpatient Procedure:                Colonoscopy Indications:              Screening patient at increased risk: Family history                            of 1st-degree relative with colorectal cancer at                            age 48 years (or older), This is the patient's                            first colonoscopy Providers:                Inocente Hausen, MD, Ozell Pouch, Felice Sar,                            Technician Referring MD:              Medicines:                Monitored Anesthesia Care Complications:            No immediate complications. Estimated blood loss:                            Minimal. Estimated Blood Loss:     Estimated blood loss was minimal. Procedure:                Pre-Anesthesia Assessment:                           - Prior to the procedure, a History and Physical                            was performed, and patient medications and                            allergies were reviewed. The patient's tolerance of                            previous anesthesia was also reviewed. The risks                            and benefits of the procedure and the sedation                            options and risks were discussed with the patient.                            All questions were answered, and informed consent                            was obtained. Prior Anticoagulants:  The patient has                            taken no anticoagulant or antiplatelet agents. ASA                            Grade Assessment: II - A patient with mild systemic                            disease. After reviewing the risks and benefits,                            the patient was deemed in satisfactory condition to                            undergo the procedure.                            After obtaining informed consent, the colonoscope                            was passed under direct vision. Throughout the                            procedure, the patient's blood pressure, pulse, and                            oxygen saturations were monitored continuously. The                            CF-HQ190L ([id number]) Olympus colonoscope was                            introduced through the anus and advanced to the                            cecum, identified by appendiceal orifice and                            ileocecal valve. The colonoscopy was performed                            without difficulty. The patient tolerated the                            procedure well. The quality of the bowel                            preparation was good. The ileocecal valve,                            appendiceal orifice, and rectum were photographed. Scope In: 10:08:56 AM Scope Out: 10:25:24 AM Scope Withdrawal Time: 0 hours 13 minutes 23 seconds  Total Procedure Duration: 0 hours 16 minutes 28 seconds  Findings:      The perianal and digital rectal examinations were normal. Pertinent       negatives include normal sphincter tone and no palpable rectal lesions.      A 4 mm polyp was found in the transverse colon. The polyp was sessile.       The polyp was removed with a cold biopsy forceps. Resection and       retrieval were complete.      Four sessile polyps were found in the rectum and sigmoid colon (3). The       polyps were 4 to 5 mm in size. These polyps were removed with a cold       snare. Resection and retrieval were complete.      The retroflexed view of the distal rectum and anal verge was normal and       showed no anal or rectal abnormalities. Impression:               - One 4 mm polyp in the transverse colon, removed                            with a cold biopsy forceps. Resected and retrieved.                           - Four 4 to 5 mm polyps in the  rectum and in the                            sigmoid colon, removed with a cold snare. Resected                            and retrieved. Moderate Sedation:      Not Applicable - Patient had care per Anesthesia. Recommendation:           - Discharge patient to home (ambulatory).                           - Await pathology results.                           - Repeat colonoscopy for surveillance based on                            pathology results.                           - The findings and recommendations were discussed                            with the patient's family.                           - The findings and recommendations were discussed                            with the patient.                           - Patient  has a contact number available for                            emergencies. The signs and symptoms of potential                            delayed complications were discussed with the                            patient. Return to normal activities tomorrow.                            Written discharge instructions were provided to the                            patient. Procedure Code(s):        --- Professional ---                           [phone], Colonoscopy, flexible; with removal of                            tumor(s), polyp(s), or other lesion(s) by snare                            technique                           45380, 59, Colonoscopy, flexible; with biopsy,                            single or multiple Diagnosis Code(s):        --- Professional ---                           Z80.0, Family history of malignant neoplasm of                            digestive organs                           D12.3, Benign neoplasm of transverse colon (hepatic                            flexure or splenic flexure)                           D12.8, Benign neoplasm of rectum                           D12.5, Benign neoplasm of sigmoid colon CPT copyright 2022 American  Medical Association. All rights reserved. The codes documented in this report are preliminary and upon coder review may  be revised to meet current compliance requirements. Inocente Hausen, MD 11/17/2023 10:32:29 AM This report has been signed electronically. Number of Addenda: 0

## 2023-11-17 NOTE — Anesthesia Postprocedure Evaluation (Signed)
 Anesthesia Post Note  Patient: Randall Trujillo  Procedure(s) Performed: COLONOSCOPY     Patient location during evaluation: PACU Anesthesia Type: MAC Level of consciousness: awake and alert, oriented and patient cooperative Pain management: pain level controlled Vital Signs Assessment: post-procedure vital signs reviewed and stable Respiratory status: spontaneous breathing, nonlabored ventilation and respiratory function stable Cardiovascular status: blood pressure returned to baseline and stable Postop Assessment: no apparent nausea or vomiting Anesthetic complications: no   No notable events documented.  Last Vitals:  Vitals:   11/17/23 1040 11/17/23 1050  BP: 127/82 (!) 152/93  Pulse: 62 (!) 51  Resp: 16 12  Temp:    SpO2: 98% 99%    Last Pain:  Vitals:   11/17/23 1050  TempSrc:   PainSc: 0-No pain                 Randall Trujillo

## 2023-11-17 NOTE — Discharge Instructions (Signed)

## 2023-11-17 NOTE — Anesthesia Preprocedure Evaluation (Addendum)
 Anesthesia Evaluation  Patient identified by MRN, date of birth, ID band Patient awake    Reviewed: Allergy & Precautions, NPO status , Patient's Chart, lab work & pertinent test results  Airway Mallampati: III  TM Distance: >3 FB Neck ROM: Full    Dental  (+) Teeth Intact, Dental Advisory Given   Pulmonary former smoker   Pulmonary exam normal breath sounds clear to auscultation       Cardiovascular negative cardio ROS Normal cardiovascular exam Rhythm:Regular Rate:Normal     Neuro/Psych Seizures - (last one 2021- generalized convulsive- depakote , lamictal ), Well Controlled,   negative psych ROS   GI/Hepatic negative GI ROS, Neg liver ROS,,,  Endo/Other  negative endocrine ROS    Renal/GU negative Renal ROS  negative genitourinary   Musculoskeletal negative musculoskeletal ROS (+)    Abdominal   Peds  Hematology negative hematology ROS (+)   Anesthesia Other Findings   Reproductive/Obstetrics negative OB ROS                              Anesthesia Physical Anesthesia Plan  ASA: 2  Anesthesia Plan: MAC   Post-op Pain Management:    Induction:   PONV Risk Score and Plan: 2 and Propofol  infusion and TIVA  Airway Management Planned: Natural Airway and Simple Face Mask  Additional Equipment: None  Intra-op Plan:   Post-operative Plan:   Informed Consent: I have reviewed the patients History and Physical, chart, labs and discussed the procedure including the risks, benefits and alternatives for the proposed anesthesia with the patient or authorized representative who has indicated his/her understanding and acceptance.       Plan Discussed with: CRNA  Anesthesia Plan Comments:          Anesthesia Quick Evaluation

## 2023-11-17 NOTE — H&P (Signed)
  Gastroenterology History and Physical   Primary Care Physician:  Glendia [facility] NOVAK, MD   Reason for Procedure:  Colorectal cancer screening, family history of colorectal cancer in a first-degree relative  Plan:    Screening colonoscopy     HPI: Randall Trujillo is a 48 y.o. male undergoing screening colonoscopy for colorectal cancer screening.  This is the patient's first colonoscopy.  Chart review documents a family history of colorectal cancer in the patient's father around age 51.  Patient denies current symptoms of rectal bleeding or change in bowel habits at this time.  Patient has a history of seizure disorder for which procedure scheduled in a hospital-based setting.   Past Medical History:  Diagnosis Date   Anal fistula    CHI (closed head injury) age 63   Generalized convulsive epilepsy without mention of intractable epilepsy    Localization-related (focal) (partial) epilepsy and epileptic syndromes with complex partial seizures, without mention of intractable epilepsy    Seizures (HCC)    last seizure june 20th 2021 lasted 5 seconds made wierd moaning noise and rolled over hard in sleep witnessed by girlfriend    Past Surgical History:  Procedure Laterality Date   area removed from nipple  yrs ago   benign   FISTULOTOMY N/A 08/29/2019   Procedure: SETON PLACEMENT;  Surgeon: Debby Hila, MD;  Location: [facility];  Service: General;  Laterality: N/A;   LIGATION OF INTERNAL FISTULA TRACT N/A 11/21/2019   Procedure: LIGATION OF INTERNAL FISTULA TRACT;  Surgeon: Debby Hila, MD;  Location: [facility];  Service: General;  Laterality: N/A;   RECTAL EXAM UNDER ANESTHESIA N/A 08/29/2019   Procedure: ANAL EXAM UNDER ANESTHESIA;  Surgeon: Debby Hila, MD;  Location: [facility];  Service: General;  Laterality: N/A;    Prior to Admission medications   Medication Sig Start Date End Date Taking? Authorizing Provider   divalproex  (DEPAKOTE  ER) 500 MG 24 hr tablet Take 4 tablets (2,000 mg total) by mouth at bedtime. 04/19/23   Gayland Lauraine PARAS, NP  ibuprofen (ADVIL) 800 MG tablet Take 1 tablet by mouth 3 (three) times daily as needed.    [provider]  lamoTRIgine  (LAMICTAL  XR) 50 MG 24 hour tablet Take 3 tablets (150 mg total) by mouth at bedtime. Appointment needed for further refills 10/25/23   Gayland Lauraine PARAS, NP  oxyCODONE  (ROXICODONE ) 5 MG immediate release tablet Take 1 tablet (5 mg total) by mouth every 4 (four) hours as needed for severe pain. Patient not taking: Reported on 10/21/2022 09/14/22   Raford Lenis, MD    Current Facility-Administered Medications  Medication Dose Route Frequency Provider Last Rate Last Admin   0.9 %  sodium chloride  infusion   Intravenous Continuous Bennet Kujawa, Inocente HERO, MD       lactated ringers  infusion    Continuous PRN Suzann Inocente HERO, MD 20 mL/hr at 11/17/23 0905 20 mL/hr at 11/17/23 0905    Allergies as of 09/23/2023 - Review Complete 09/22/2023  Allergen Reaction Noted   Dilantin [phenytoin sodium extended] Rash 04/23/2015    Family History  Problem Relation Age of Onset   High blood pressure Mother    Diabetes Mother    Breast cancer Mother    High blood pressure Father    Colon cancer Father    Diabetes Father    Colon polyps Neg Hx    Crohn's disease Neg Hx    Esophageal cancer Neg Hx    Rectal  cancer Neg Hx    Stomach cancer Neg Hx     Social History   Socioeconomic History   Marital status: Married    Spouse name: Tawni   Number of children: 3   Years of education: 11   Highest education level: Not on file  Occupational History   Occupation: Self employed  Tobacco Use   Smoking status: Former    Current packs/day: 0.00    Average packs/day: 0.5 packs/day for 2.0 years (1.0 ttl pk-yrs)    Types: Cigarettes    Start date: 02/15/2013    Quit date: 02/16/2015    Years since quitting: 8.7   Smokeless tobacco: Never  Vaping Use   Vaping  status: Never Used  Substance and Sexual Activity   Alcohol use: Yes    Comment: occ   Drug use: No   Sexual activity: Not on file    Comment: Married  Other Topics Concern   Not on file  Social History Narrative   Patient is self employed and is married to ([name]).    Right handed.   Caffeine - Gallon of Tea daily.   Social Drivers of Corporate investment banker Strain: Not on file  Food Insecurity: Not on file  Transportation Needs: Not on file  Physical Activity: Not on file  Stress: Not on file  Social Connections: Not on file  Intimate Partner Violence: Not on file    Review of Systems:  All other review of systems negative except as mentioned in the HPI.  Physical Exam: Vital signs BP (!) 140/101   Pulse 68   Temp 97.6 F (36.4 C) (Temporal)   Resp 17   Ht 6' 2 (1.88 m)   Wt 97.5 kg   SpO2 99%   BMI 27.60 kg/m   General:   Alert,  Well-developed, well-nourished, pleasant and cooperative in NAD Lungs:  Clear throughout to auscultation.   Heart:  Regular rate and rhythm; no murmurs, clicks, rubs,  or gallops. Abdomen:  Soft, nontender and nondistended. Normal bowel sounds.   Neuro/Psych:  Normal mood and affect. A and O x 3  Inocente Hausen, MD [facility] Gastroenterology

## 2023-11-18 ENCOUNTER — Encounter (HOSPITAL_COMMUNITY): Payer: Self-pay | Admitting: Pediatrics

## 2023-11-18 LAB — SURGICAL PATHOLOGY

## 2023-11-21 ENCOUNTER — Ambulatory Visit: Payer: Self-pay | Admitting: Pediatrics

## 2023-11-30 ENCOUNTER — Telehealth: Payer: Self-pay | Admitting: Neurology
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
## 2023-12-04 ENCOUNTER — Other Ambulatory Visit: Payer: Self-pay | Admitting: Neurology
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
## 2023-12-06 ENCOUNTER — Ambulatory Visit: Admitting: Neurology

## 2023-12-06 ENCOUNTER — Encounter: Payer: Self-pay | Admitting: Neurology

## 2023-12-06 ENCOUNTER — Telehealth: Payer: Self-pay

## 2023-12-06 VITALS — BP 159/98 | HR 68 | Ht 74.0 in | Wt 211.5 lb

## 2023-12-06 DIAGNOSIS — G40309 Generalized idiopathic epilepsy and epileptic syndromes, not intractable, without status epilepticus: Secondary | ICD-10-CM | POA: Diagnosis not present

## 2023-12-06 MED ORDER — DIVALPROEX SODIUM ER 500 MG PO TB24: 2000.0000 mg | ORAL_TABLET | Freq: Every day | ORAL | 4 refills | Status: AC

## 2023-12-06 MED ORDER — LAMOTRIGINE ER 50 MG PO TB24
150.0000 mg | ORAL_TABLET | Freq: Every day | ORAL | 4 refills | Status: AC
Start: 2023-12-06 — End: ?

## 2023-12-06 MED ORDER — VALTOCO 20 MG DOSE 2 X 10 MG/0.1ML NA LQPK: 20.0000 mg | NASAL | 1 refills | Status: AC | PRN

## 2023-12-06 NOTE — Patient Instructions (Signed)
 Check MRI brain, check EEG Check labs today  Continue current dose of Depakote  and Lamictal  No driving until seizure-free for 6 months  Valtoco 20 mg at onset of acute prolonged seizure event, 2 nasal spray devices, 1 into each nostril  Call for seizures

## 2023-12-07 ENCOUNTER — Telehealth: Payer: Self-pay | Admitting: Neurology

## 2023-12-07 DIAGNOSIS — Z0289 Encounter for other administrative examinations: Secondary | ICD-10-CM

## 2023-12-07 LAB — COMPREHENSIVE METABOLIC PANEL WITH GFR
ALT: 15 IU/L (ref 0–44)
AST: 13 IU/L (ref 0–40)
Albumin: 4.5 g/dL (ref 4.1–5.1)
Alkaline Phosphatase: 91 IU/L (ref 47–123)
BUN/Creatinine Ratio: 15 (ref 9–20)
BUN: 14 mg/dL (ref 6–24)
Bilirubin Total: <0.2 mg/dL (ref 0.0–1.2)
CO2: 24 mmol/L (ref 20–29)
Calcium: 9.7 mg/dL (ref 8.7–10.2)
Chloride: 103 mmol/L (ref 96–106)
Creatinine, Ser: 0.94 mg/dL (ref 0.76–1.27)
Globulin, Total: 2.4 g/dL (ref 1.5–4.5)
Glucose: 72 mg/dL (ref 70–99)
Potassium: 4.1 mmol/L (ref 3.5–5.2)
Sodium: 140 mmol/L (ref 134–144)
Total Protein: 6.9 g/dL (ref 6.0–8.5)
eGFR: 99 mL/min/1.73 (ref >59)

## 2023-12-07 LAB — CBC WITH DIFFERENTIAL/PLATELET
Basophils Absolute: 0.1 x10E3/uL (ref 0.0–0.2)
Basos: 1 %
EOS (ABSOLUTE): 0.3 x10E3/uL (ref 0.0–0.4)
Eos: 4 %
Hematocrit: 45.5 % (ref 37.5–51.0)
Hemoglobin: 15 g/dL (ref 13.0–17.7)
Immature Grans (Abs): 0 x10E3/uL (ref 0.0–0.1)
Immature Granulocytes: 0 %
Lymphocytes Absolute: 2.5 x10E3/uL (ref 0.7–3.1)
Lymphs: 31 %
MCH: 33 pg (ref 26.6–33.0)
MCHC: 33 g/dL (ref 31.5–35.7)
MCV: 100 fL — ABNORMAL HIGH (ref 79–97)
Monocytes Absolute: 0.6 x10E3/uL (ref 0.1–0.9)
Monocytes: 8 %
Neutrophils Absolute: 4.6 x10E3/uL (ref 1.4–7.0)
Neutrophils: 56 %
Platelets: 326 x10E3/uL (ref 150–450)
RBC: 4.55 x10E6/uL (ref 4.14–5.80)
RDW: 13.3 % (ref 11.6–15.4)
WBC: 8.1 x10E3/uL (ref 3.4–10.8)

## 2023-12-07 LAB — LAMOTRIGINE LEVEL: Lamotrigine Lvl: 5.8 ug/mL (ref 2.0–20.0)

## 2023-12-07 LAB — VALPROIC ACID LEVEL: Valproic Acid Lvl: 85 ug/mL (ref 50–100)

## 2023-12-07 NOTE — Telephone Encounter (Signed)
MRI order sent to [facility] [phone]

## 2023-12-08 ENCOUNTER — Telehealth: Payer: Self-pay | Admitting: Neurology

## 2023-12-08 ENCOUNTER — Ambulatory Visit: Payer: Self-pay | Admitting: Neurology

## 2023-12-08 NOTE — Telephone Encounter (Signed)
 I tried to call the patient. I left a message asking him to call back. Labs look good. We can continue with current dosing of medication, not to miss any doses. Depakote  level is 85, Lamictal  5.8. Thanks

## 2023-12-12 ENCOUNTER — Other Ambulatory Visit (HOSPITAL_COMMUNITY): Payer: Self-pay

## 2023-12-12 ENCOUNTER — Telehealth: Payer: Self-pay | Admitting: Pharmacist

## 2023-12-12 NOTE — Telephone Encounter (Addendum)
 Called pt at [phone]. LVM  Called [phone]. Spoke w/ pt about lab results per SS,NP note. Pt verbalized understanding.   He asked about DMV forms. I relayed they are currently being worked on. We will have medical records follow up once turned in. He verbalized understanding.  FyiGLENWOOD Trujillo has forms and currently working on.

## 2023-12-12 NOTE — Telephone Encounter (Signed)
 Pharmacy Patient Advocate Encounter   Received notification from Patient Pharmacy that prior authorization for Valtoco 20 MG Dose 2 x 10MG /0.1ML liquid  is required/requested.   Insurance verification completed.   The patient is insured through [employer].   Per test claim: PA required; PA submitted to above mentioned insurance via Latent Key/confirmation #/EOC [id number] Status is pending

## 2023-12-13 NOTE — Telephone Encounter (Signed)
 Pharmacy Patient Advocate Encounter  Received notification from [employer] that Prior Authorization for Valtoco 20 MG Dose 2 x 10MG /0.1ML liquid has been APPROVED from 12/12/2023 to 12/11/2024   PA #/Case ID/Reference #: [id number]
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
## 2023-12-28 ENCOUNTER — Telehealth: Payer: Self-pay | Admitting: *Deleted

## 2023-12-28 NOTE — Telephone Encounter (Signed)
Called pt not able to reach pt. 

## 2024-01-26 ENCOUNTER — Telehealth: Payer: Self-pay | Admitting: *Deleted

## 2024-01-26 NOTE — Telephone Encounter (Signed)
 error

## 2024-06-11 ENCOUNTER — Ambulatory Visit: Admitting: Neurology
